# Patient Record
Sex: Female | Born: 1980 | Race: Black or African American | Hispanic: No | Marital: Single | Smoking: Never smoker
Health system: Southern US, Community
[De-identification: ages and names within clinical notes are randomized; demographics above are authoritative.]

## PROBLEM LIST (undated history)

## (undated) HISTORY — PX: OVARY BIOPSY: SHX2143

## (undated) HISTORY — PX: COLPOSCOPY: SHX161

---

## 2003-11-25 ENCOUNTER — Emergency Department (HOSPITAL_COMMUNITY): Admission: EM | Admit: 2003-11-25 | Discharge: 2003-11-26 | Payer: Self-pay | Admitting: *Deleted

## 2007-10-21 ENCOUNTER — Emergency Department: Payer: Self-pay | Admitting: Emergency Medicine

## 2008-04-21 ENCOUNTER — Emergency Department: Payer: Self-pay | Admitting: Emergency Medicine

## 2009-02-21 ENCOUNTER — Ambulatory Visit: Payer: Self-pay

## 2013-03-29 ENCOUNTER — Ambulatory Visit: Payer: Self-pay

## 2013-06-02 ENCOUNTER — Emergency Department (HOSPITAL_COMMUNITY)
Admission: EM | Admit: 2013-06-02 | Discharge: 2013-06-03 | Disposition: A | Discharge status: Home / Self Care | Payer: Self-pay | Attending: Emergency Medicine | Admitting: Emergency Medicine

## 2013-06-02 DIAGNOSIS — N760 Acute vaginitis: Secondary | ICD-10-CM | POA: Insufficient documentation

## 2013-06-02 DIAGNOSIS — Z3202 Encounter for pregnancy test, result negative: Secondary | ICD-10-CM | POA: Insufficient documentation

## 2013-06-02 DIAGNOSIS — B9689 Other specified bacterial agents as the cause of diseases classified elsewhere: Secondary | ICD-10-CM

## 2013-06-03 ENCOUNTER — Encounter (HOSPITAL_COMMUNITY): Payer: Self-pay | Admitting: Family Medicine

## 2013-06-03 LAB — WET PREP, GENITAL
Trich, Wet Prep: NONE SEEN
Yeast Wet Prep HPF POC: NONE SEEN

## 2013-06-03 LAB — GC/CHLAMYDIA PROBE AMP: GC Probe RNA: NEGATIVE

## 2013-06-03 LAB — URINALYSIS, ROUTINE W REFLEX MICROSCOPIC
Leukocytes, UA: NEGATIVE
Nitrite: NEGATIVE
Specific Gravity, Urine: 1.032 — ABNORMAL HIGH (ref 1.005–1.030)
Urobilinogen, UA: 0.2 mg/dL (ref 0.0–1.0)
pH: 6 (ref 5.0–8.0)

## 2013-06-03 MED ORDER — METRONIDAZOLE 500 MG PO TABS: 500.0000 mg | ORAL_TABLET | Freq: Two times a day (BID) | ORAL | Status: DC

## 2013-06-03 NOTE — ED Provider Notes (Signed)
CSN: [id number]     Arrival date & time 06/02/13  2239 History   First MD Initiated Contact with Patient 06/03/13 0037     Chief Complaint  Patient presents with  . Vaginal Bleeding   (Consider location/radiation/quality/duration/timing/severity/associated sxs/prior Treatment) HPI Comments: Patient is a 32 year old female with a history of colposcopy who presents for lack of vaginal bleeding x3 days. Patient states that she has been experiencing vaginal bleeding for the last 3 years. Patient states she usually goes through between 5-8 pads per day depending on her flow. Patient states that bleeding stopped this past Monday. Patient states she was evaluated for her vaginal bleeding in the past, but was unable to determine why she was bleeding so consistently. Patient does endorse associated b/l breast soreness. She denies any associated fever, shortness of breath, nausea or vomiting, abdominal pain, nipple discharge or bleeding, pelvic pain, vaginal discharge, dyspareunia, dysuria, hematuria, back pain, and numbness or tingling in her extremities.   Patient is a 32 y.o. female presenting with vaginal bleeding. The history is provided by the patient. No language interpreter was used.  Vaginal Bleeding Associated symptoms: no fever     History reviewed. No pertinent past medical history. Past Surgical History  Procedure Laterality Date  . Ovary biopsy    . Colposcopy     No family history on file. History  Substance Use Topics  . Smoking status: Never Smoker   . Smokeless tobacco: Not on file  . Alcohol Use: Yes     Comment: Occasional   OB History   Grav Para Term Preterm Abortions TAB SAB Ect Mult Living                 Review of Systems  Constitutional: Negative for fever.  Genitourinary: Negative for vaginal bleeding.  All other systems reviewed and are negative.   Allergies  Imitrex  Home Medications   Current Outpatient Rx  Name  Route  Sig  Dispense  Refill  .  Multiple Vitamin (MULTIVITAMIN WITH MINERALS) TABS tablet   Oral   Take 1 tablet by mouth daily.         . metroNIDAZOLE (FLAGYL) 500 MG tablet   Oral   Take 1 tablet (500 mg total) by mouth 2 (two) times daily.   14 tablet   0    BP 152/71  Pulse 84  Temp(Src) 98.3 F (36.8 C) (Oral)  Resp 20  Ht 5\' 8"  (1.727 m)  Wt 220 lb (99.791 kg)  BMI 33.46 kg/m2  SpO2 100%  Physical Exam  Nursing note and vitals reviewed. Constitutional: She is oriented to person, place, and time. She appears well-developed and well-nourished. No distress.  HENT:  Head: Normocephalic and atraumatic.  Mouth/Throat: Oropharynx is clear and moist. No oropharyngeal exudate.  Eyes: Conjunctivae and EOM are normal. Pupils are equal, round, and reactive to light. No scleral icterus.  Neck: Normal range of motion.  Cardiovascular: Normal rate, regular rhythm and normal heart sounds.   Pulmonary/Chest: Effort normal and breath sounds normal. No respiratory distress. She has no wheezes. She has no rales.  Abdominal: Soft. She exhibits no distension. There is no tenderness. There is no rebound and no guarding.  Genitourinary: Vagina normal and uterus normal. There is no rash, tenderness, lesion or injury on the right labia. There is no rash, tenderness, lesion or injury on the left labia. Cervix exhibits no motion tenderness, no discharge and no friability. Right adnexum displays no mass, no tenderness and no fullness.  Left adnexum displays no mass, no tenderness and no fullness.  Chaperoned GYN exam unremarkable  Musculoskeletal: Normal range of motion.  Neurological: She is alert and oriented to person, place, and time.  Skin: Skin is warm and dry. No rash noted. She is not diaphoretic. No erythema. No pallor.  Psychiatric: She has a normal mood and affect. Her behavior is normal.    ED Course  Procedures (including critical care time) Labs Review Labs Reviewed  WET PREP, GENITAL - Abnormal; Notable for  the following:    Clue Cells Wet Prep HPF POC MANY (*)    WBC, Wet Prep HPF POC RARE (*)    All other components within normal limits  URINALYSIS, ROUTINE W REFLEX MICROSCOPIC - Abnormal; Notable for the following:    APPearance CLOUDY (*)    Specific Gravity, Urine 1.032 (*)    All other components within normal limits  GC/CHLAMYDIA PROBE AMP  PREGNANCY, URINE   Imaging Review No results found.  MDM   1. Bacterial vaginosis    32 year old otherwise healthy female presents for a lack of vaginal bleeding x3 days. Patient with history of persistent vaginal bleeding for the last 3 years. Patient well and nontoxic appearing on initial presentation. She is hemodynamically stable and afebrile. Physical exam findings unremarkable and chaperoned GYN exam without any significant findings or tenderness. Urine pregnancy negative and urinalysis findings nonsuggestive of UTI. Wet prep findings consistent with bacterial vaginosis as many clue cells seen. GC chlamydia culture pending. Given patient's hemodynamic stability and reassuring physical exam, do not believe further workup with labs or imaging is warranted at this time. Patient stable and appropriate for discharge with OB/GYN followup for further evaluation of symptoms. Flagyl prescribed for treatment of bacterial vaginosis. Return precautions discussed and patient agreeable to plan with no unaddressed concerns.    Antony Madura, PA-C 06/03/13 [id number]

## 2013-06-03 NOTE — ED Provider Notes (Signed)
Medical screening examination/treatment/procedure(s) were performed by non-physician practitioner and as supervising physician I was immediately available for consultation/collaboration.  Shakur Lembo M Kerrianne Jeng, MD 06/03/13 0647 

## 2013-06-03 NOTE — ED Notes (Signed)
Patient states that she has had vaginal bleeding constantly for the past 3 years. States that the bleeding stopped on Monday and she is concerned "because this is unusual. I need to know what is going on." Denies n/v/d or cramping. Only other associated symptoms are breast soreness.

## 2014-05-02 ENCOUNTER — Emergency Department (INDEPENDENT_AMBULATORY_CARE_PROVIDER_SITE_OTHER)
Admission: EM | Admit: 2014-05-02 | Discharge: 2014-05-02 | Disposition: A | Discharge status: Home / Self Care | Payer: PRIVATE HEALTH INSURANCE | Source: Home / Self Care

## 2014-05-02 ENCOUNTER — Encounter (HOSPITAL_COMMUNITY): Payer: Self-pay | Admitting: Emergency Medicine

## 2014-05-02 DIAGNOSIS — H109 Unspecified conjunctivitis: Secondary | ICD-10-CM

## 2014-05-02 MED ORDER — DEXAMETHASONE 0.1 % OP SUSP: 1.0000 [drp] | Freq: Two times a day (BID) | OPHTHALMIC | Status: AC

## 2014-05-02 MED ORDER — TETRACAINE HCL 0.5 % OP SOLN
OPHTHALMIC | Status: AC
  Filled 2014-05-02: qty 2

## 2014-05-02 MED ORDER — POLYMYXIN B-TRIMETHOPRIM 10000-0.1 UNIT/ML-% OP SOLN: 1.0000 [drp] | OPHTHALMIC | Status: AC

## 2014-05-02 NOTE — ED Provider Notes (Signed)
CSN: [id number]     Arrival date & time 05/02/14  1039 History   First MD Initiated Contact with Patient 05/02/14 1135     Chief Complaint  Patient presents with  . Eye Pain   (Consider location/radiation/quality/duration/timing/severity/associated sxs/prior Treatment) HPI Comments: C/O watery itchy eyes that started gradually yesterday AM. Mild blurring. Denies FB sensation or pain.    History reviewed. No pertinent past medical history. Past Surgical History  Procedure Laterality Date  . Ovary biopsy    . Colposcopy     History reviewed. No pertinent family history. History  Substance Use Topics  . Smoking status: Never Smoker   . Smokeless tobacco: Not on file  . Alcohol Use: Yes     Comment: Occasional   OB History   Grav Para Term Preterm Abortions TAB SAB Ect Mult Living                 Review of Systems  Constitutional: Positive for activity change.  Eyes: Positive for photophobia, discharge, redness, itching and visual disturbance.  All other systems reviewed and are negative.   Allergies  Imitrex  Home Medications   Prior to Admission medications   Medication Sig Start Date End Date Taking? Authorizing Provider  dexamethasone (DECADRON) 0.1 % ophthalmic suspension Place 1 drop into both eyes 2 (two) times daily. 05/02/14   Hayden Rasmussenavid Harding Thomure, NP  Multiple Vitamin (MULTIVITAMIN WITH MINERALS) TABS tablet Take 1 tablet by mouth daily.    Historical Provider, MD  trimethoprim-polymyxin b (POLYTRIM) ophthalmic solution Place 1 drop into both eyes every 4 (four) hours. 05/02/14   Hayden Rasmussenavid Ridley Dileo, NP   BP 149/79  Pulse 79  Temp(Src) 98.3 F (36.8 C) (Oral)  Resp 18  SpO2 96% Physical Exam  Nursing note and vitals reviewed. Constitutional: She is oriented to person, place, and time. She appears well-developed and well-nourished. No distress.  Eyes: EOM are normal. Pupils are equal, round, and reactive to light.  Minor conjunctival redness. Minor scleral injection.   Anterior chamber clear. No current drainage.  Neck: Normal range of motion. Neck supple.  Pulmonary/Chest: Effort normal. No respiratory distress.  Neurological: She is alert and oriented to person, place, and time.  Skin: Skin is warm.  Psychiatric: She has a normal mood and affect.    ED Course  Procedures (including critical care time) Labs Review Labs Reviewed - No data to display  Imaging Review No results found.   MDM   1. Bilateral conjunctivitis    polytrim op gttts dexamethsone opthal as dir. F/U with opthal listed above if not improving in 2 d.     Hayden Rasmussenavid Taitum Alms, NP 05/02/14 1153

## 2014-05-02 NOTE — ED Notes (Addendum)
C/o bilateral eye pain and redness. "I tink I have pink eye in both eyes"

## 2014-05-02 NOTE — ED Provider Notes (Signed)
Medical screening examination/treatment/procedure(s) were performed by a resident physician or non-physician practitioner and as the supervising physician I was immediately available for consultation/collaboration.  Janique Hoefer, MD Family Medicine   Lonnette Shrode J Jmya Uliano, MD 05/02/14 2136 

## 2014-05-02 NOTE — Discharge Instructions (Signed)
Eye Drops Use eye drops as directed. It may be easier to have someone help you put the drops in your eye. If you are alone, use the following instructions to help you.  Wash your hands before putting drops in your eyes.  Read the label and look at your medication. Check for any expiration date that may appear on the bottle or tube. Changes of color may be a warning that the medication is old or ineffective. This is especially true if the medication has become brown in color. If you have questions or concerns, call your caregiver. DROPS  Tilt your head back with the affected eye uppermost. Gently pull down on your lower lid. Do not pull up on the upper lid.  Look up. Place the dropper or bottle just over the edge of the lower lid near the white portion at the bottom of the eye. The goal is to have the drop go into the little sac formed by the lower lid and the bottom of the eye itself. Do not release the drop from a height of several inches over the eye. That will only serve to startle the person receiving the medicine when it lands and forces a blink.  Steady your hand in a comfortable manner. An example would be to hold the dropper or bottle between your thumb and index (pointing) finger. Lean your index finger against the brow.  Then, slowly and gently squeeze one drop of medication into your eye.  Once the medication has been applied, place your finger between the lower eyelid and the nose, pressing firmly against the nose for 5-10 seconds. This will slow the process of the eye drop entering the small canal that normally drains tears into the nose, and therefore increases the exposure of the medicine to the eye for a few extra seconds. OINTMENTS  Look up. Place the tip of the tube just over the edge of the lower lid near the white portion at the bottom of the eye. The goal is to create a line of ointment along the inner surface of the eyelid in the little sac formed by the lower lid and the  bottom of the eye itself.  Avoid touching the tube tip to your eyeball or eyelid. This avoids contamination of the tube or the medicine in the tube.  Once a line of medicine has been created, hold the upper lid up and look down before releasing the upper lid. This will force the ointment to spread over the surface of the eye.  Your vision will be very blurry for a few minutes after applying an ointment properly. This is normal and will clear as you continue to blink. For this reason, it is best to apply ointments just before going to sleep, or at a time when you can rest your eyes for 5-10 minutes after applying the medication. GENERAL  Store your medicine in a cool, dry place after each use.  If you need a second medication, wait at least two minutes. This helps the first medication to be taken up (absorbed) by the eye.  If you have been instructed to use both an eye drop and an eye ointment, always apply the drop first and then the ointment 3-4 minutes afterward. Never put medications into the eye unless the label reads, "For Ophthalmic Use," "For Use In Eyes" or "Eye Drops." If you have questions, call your caregiver. Document Released: 12/16/2000 Document Revised: 01/24/2014 Document Reviewed: 02/21/2009 ExitCare Patient Information 2015 [employer]. This   information is not intended to replace advice given to you by your health care provider. Make sure you discuss any questions you have with your health care provider.  Conjunctivitis The cause of your eye symptoms appear most like allergic origin. Less likely bacterial pink eye. Use warm compresses to your eyes for comfort and cleaning. Conjunctivitis is commonly called "pink eye." Conjunctivitis can be caused by bacterial or viral infection, allergies, or injuries. There is usually redness of the lining of the eye, itching, discomfort, and sometimes discharge. There may be deposits of matter along the eyelids. A viral infection usually  causes a watery discharge, while a bacterial infection causes a yellowish, thick discharge. Pink eye is very contagious and spreads by direct contact. You may be given antibiotic eyedrops as part of your treatment. Before using your eye medicine, remove all drainage from the eye by washing gently with warm water and cotton balls. Continue to use the medication until you have awakened 2 mornings in a row without discharge from the eye. Do not rub your eye. This increases the irritation and helps spread infection. Use separate towels from other household members. Wash your hands with soap and water before and after touching your eyes. Use cold compresses to reduce pain and sunglasses to relieve irritation from light. Do not wear contact lenses or wear eye makeup until the infection is gone. SEEK MEDICAL CARE IF:   Your symptoms are not better after 3 days of treatment.  You have increased pain or trouble seeing.  The outer eyelids become very red or swollen. Document Released: 10/17/2004 Document Revised: 12/02/2011 Document Reviewed: 09/09/2005 [facility] Patient Information 2015 [place]. This information is not intended to replace advice given to you by your health care provider. Make sure you discuss any questions you have with your health care provider.

## 2014-09-22 ENCOUNTER — Encounter (HOSPITAL_COMMUNITY)
Admission: EM | Disposition: A | Discharge status: Home / Self Care | Payer: Self-pay | Source: Home / Self Care | Attending: Emergency Medicine

## 2014-09-22 ENCOUNTER — Emergency Department (HOSPITAL_COMMUNITY): Payer: PRIVATE HEALTH INSURANCE

## 2014-09-22 ENCOUNTER — Other Ambulatory Visit (HOSPITAL_COMMUNITY): Payer: Self-pay | Admitting: Orthopedic Surgery

## 2014-09-22 ENCOUNTER — Emergency Department (HOSPITAL_COMMUNITY): Payer: PRIVATE HEALTH INSURANCE | Admitting: Certified Registered"

## 2014-09-22 ENCOUNTER — Ambulatory Visit (HOSPITAL_COMMUNITY)
Admission: EM | Admit: 2014-09-22 | Discharge: 2014-09-25 | Disposition: A | Discharge status: Home / Self Care | Payer: PRIVATE HEALTH INSURANCE | Attending: Emergency Medicine | Admitting: Emergency Medicine

## 2014-09-22 ENCOUNTER — Ambulatory Visit: Admit: 2014-09-22 | Payer: Self-pay | Admitting: Orthopedic Surgery

## 2014-09-22 ENCOUNTER — Encounter (HOSPITAL_COMMUNITY): Payer: Self-pay | Admitting: Anesthesiology

## 2014-09-22 DIAGNOSIS — Z888 Allergy status to other drugs, medicaments and biological substances status: Secondary | ICD-10-CM | POA: Insufficient documentation

## 2014-09-22 DIAGNOSIS — S82851A Displaced trimalleolar fracture of right lower leg, initial encounter for closed fracture: Secondary | ICD-10-CM | POA: Insufficient documentation

## 2014-09-22 DIAGNOSIS — Y9289 Other specified places as the place of occurrence of the external cause: Secondary | ICD-10-CM | POA: Diagnosis not present

## 2014-09-22 DIAGNOSIS — W1789XA Other fall from one level to another, initial encounter: Secondary | ICD-10-CM | POA: Insufficient documentation

## 2014-09-22 DIAGNOSIS — W19XXXA Unspecified fall, initial encounter: Secondary | ICD-10-CM

## 2014-09-22 DIAGNOSIS — S82853A Displaced trimalleolar fracture of unspecified lower leg, initial encounter for closed fracture: Secondary | ICD-10-CM | POA: Diagnosis present

## 2014-09-22 HISTORY — PX: ORIF ANKLE FRACTURE: SHX5408

## 2014-09-22 LAB — TYPE AND SCREEN
ABO/RH(D): A POS
Antibody Screen: NEGATIVE

## 2014-09-22 LAB — BASIC METABOLIC PANEL
ANION GAP: 9 (ref 5–15)
BUN: 11 mg/dL (ref 6–23)
CALCIUM: 9.1 mg/dL (ref 8.4–10.5)
CO2: 24 mmol/L (ref 19–32)
Chloride: 106 mEq/L (ref 96–112)
Creatinine, Ser: 0.75 mg/dL (ref 0.50–1.10)
GFR calc Af Amer: >90 mL/min (ref >90)
Glucose, Bld: 114 mg/dL — ABNORMAL HIGH (ref 70–99)
POTASSIUM: 3.7 mmol/L (ref 3.5–5.1)
SODIUM: 139 mmol/L (ref 135–145)

## 2014-09-22 LAB — CBC WITH DIFFERENTIAL/PLATELET
BASOS ABS: 0 10*3/uL (ref 0.0–0.1)
Basophils Relative: 0 % (ref 0–1)
EOS ABS: 0.1 10*3/uL (ref 0.0–0.7)
EOS PCT: 1 % (ref 0–5)
HCT: 40.7 % (ref 36.0–46.0)
Hemoglobin: 12.7 g/dL (ref 12.0–15.0)
LYMPHS PCT: 48 % — AB (ref 12–46)
Lymphs Abs: 3.2 10*3/uL (ref 0.7–4.0)
MCH: 24.5 pg — ABNORMAL LOW (ref 26.0–34.0)
MCHC: 31.2 g/dL (ref 30.0–36.0)
MCV: 78.6 fL (ref 78.0–100.0)
Monocytes Absolute: 0.4 10*3/uL (ref 0.1–1.0)
Monocytes Relative: 6 % (ref 3–12)
NEUTROS PCT: 45 % (ref 43–77)
Neutro Abs: 3 10*3/uL (ref 1.7–7.7)
PLATELETS: 346 10*3/uL (ref 150–400)
RBC: 5.18 MIL/uL — ABNORMAL HIGH (ref 3.87–5.11)
RDW: 14.9 % (ref 11.5–15.5)
WBC: 6.8 10*3/uL (ref 4.0–10.5)

## 2014-09-22 LAB — POC URINE PREG, ED: PREG TEST UR: NEGATIVE

## 2014-09-22 LAB — ABO/RH: ABO/RH(D): A POS

## 2014-09-22 SURGERY — OPEN REDUCTION INTERNAL FIXATION (ORIF) ANKLE FRACTURE
Anesthesia: General | Site: Ankle | Laterality: Right

## 2014-09-22 MED ORDER — MORPHINE SULFATE 4 MG/ML IJ SOLN
6.0000 mg | Freq: Once | INTRAMUSCULAR | Status: AC
  Administered 2014-09-22: 6 mg via INTRAVENOUS

## 2014-09-22 MED ORDER — PROMETHAZINE HCL 25 MG/ML IJ SOLN
INTRAMUSCULAR | Status: AC
  Filled 2014-09-22: qty 1

## 2014-09-22 MED ORDER — METHOCARBAMOL 500 MG PO TABS
ORAL_TABLET | ORAL | Status: AC
  Filled 2014-09-22: qty 1

## 2014-09-22 MED ORDER — LIDOCAINE HCL (CARDIAC) 20 MG/ML IV SOLN
INTRAVENOUS | Status: AC
  Filled 2014-09-22: qty 5

## 2014-09-22 MED ORDER — FENTANYL CITRATE 0.05 MG/ML IJ SOLN
INTRAMUSCULAR | Status: DC | PRN
  Administered 2014-09-22: 50 ug via INTRAVENOUS
  Administered 2014-09-22: 25 ug via INTRAVENOUS
  Administered 2014-09-22: 50 ug via INTRAVENOUS
  Administered 2014-09-22: 25 ug via INTRAVENOUS
  Administered 2014-09-22: 100 ug via INTRAVENOUS

## 2014-09-22 MED ORDER — POLYETHYLENE GLYCOL 3350 17 G PO PACK: 17.0000 g | PACK | Freq: Every day | ORAL | Status: DC | PRN

## 2014-09-22 MED ORDER — 0.9 % SODIUM CHLORIDE (POUR BTL) OPTIME
TOPICAL | Status: DC | PRN
  Administered 2014-09-22: 1000 mL

## 2014-09-22 MED ORDER — DOCUSATE SODIUM 100 MG PO CAPS
100.0000 mg | ORAL_CAPSULE | Freq: Two times a day (BID) | ORAL | Status: DC
  Administered 2014-09-22 – 2014-09-25 (×6): 100 mg via ORAL
  Filled 2014-09-22 (×7): qty 1

## 2014-09-22 MED ORDER — ONDANSETRON HCL 4 MG/2ML IJ SOLN: 4.0000 mg | Freq: Four times a day (QID) | INTRAMUSCULAR | Status: DC | PRN

## 2014-09-22 MED ORDER — ARTIFICIAL TEARS OP OINT
TOPICAL_OINTMENT | OPHTHALMIC | Status: DC | PRN
  Administered 2014-09-22: 1 via OPHTHALMIC

## 2014-09-22 MED ORDER — MAGNESIUM CITRATE PO SOLN: 1.0000 | Freq: Once | ORAL | Status: AC | PRN

## 2014-09-22 MED ORDER — MIDAZOLAM HCL 2 MG/2ML IJ SOLN
INTRAMUSCULAR | Status: AC
  Filled 2014-09-22: qty 2

## 2014-09-22 MED ORDER — LACTATED RINGERS IV SOLN
INTRAVENOUS | Status: DC | PRN
  Administered 2014-09-22: 18:00:00 via INTRAVENOUS

## 2014-09-22 MED ORDER — DEXAMETHASONE SODIUM PHOSPHATE 4 MG/ML IJ SOLN
INTRAMUSCULAR | Status: DC | PRN
  Administered 2014-09-22: 8 mg via INTRAVENOUS

## 2014-09-22 MED ORDER — ONDANSETRON HCL 4 MG/2ML IJ SOLN
INTRAMUSCULAR | Status: DC | PRN
  Administered 2014-09-22: 4 mg via INTRAVENOUS

## 2014-09-22 MED ORDER — SODIUM CHLORIDE 0.9 % IV SOLN
INTRAVENOUS | Status: DC
  Administered 2014-09-22: 16:00:00 via INTRAVENOUS

## 2014-09-22 MED ORDER — PROMETHAZINE HCL 25 MG/ML IJ SOLN
6.2500 mg | INTRAMUSCULAR | Status: DC | PRN
  Administered 2014-09-22: 6.25 mg via INTRAVENOUS

## 2014-09-22 MED ORDER — HYDROMORPHONE HCL 1 MG/ML IJ SOLN
1.0000 mg | Freq: Once | INTRAMUSCULAR | Status: AC
  Administered 2014-09-22: 1 mg via INTRAVENOUS
  Filled 2014-09-22: qty 1

## 2014-09-22 MED ORDER — CEFAZOLIN SODIUM 1-5 GM-% IV SOLN
INTRAVENOUS | Status: AC
  Filled 2014-09-22: qty 50

## 2014-09-22 MED ORDER — ASPIRIN EC 325 MG PO TBEC
325.0000 mg | DELAYED_RELEASE_TABLET | Freq: Every day | ORAL | Status: DC
  Administered 2014-09-23 – 2014-09-25 (×3): 325 mg via ORAL
  Filled 2014-09-22 (×3): qty 1

## 2014-09-22 MED ORDER — CEFAZOLIN SODIUM-DEXTROSE 2-3 GM-% IV SOLR
2.0000 g | Freq: Four times a day (QID) | INTRAVENOUS | Status: AC
  Administered 2014-09-22 – 2014-09-23 (×3): 2 g via INTRAVENOUS
  Filled 2014-09-22 (×3): qty 50

## 2014-09-22 MED ORDER — PROPOFOL 10 MG/ML IV BOLUS
INTRAVENOUS | Status: AC
  Filled 2014-09-22: qty 20

## 2014-09-22 MED ORDER — ONDANSETRON HCL 4 MG/2ML IJ SOLN
INTRAMUSCULAR | Status: AC
  Administered 2014-09-22: 15:00:00 4 mg via INTRAVENOUS
  Filled 2014-09-22: qty 2

## 2014-09-22 MED ORDER — OXYCODONE-ACETAMINOPHEN 5-325 MG PO TABS
1.0000 | ORAL_TABLET | ORAL | Status: DC | PRN
  Administered 2014-09-22 – 2014-09-25 (×12): 2 via ORAL
  Filled 2014-09-22 (×11): qty 2

## 2014-09-22 MED ORDER — FENTANYL CITRATE 0.05 MG/ML IJ SOLN
INTRAMUSCULAR | Status: AC
  Filled 2014-09-22: qty 5

## 2014-09-22 MED ORDER — METOCLOPRAMIDE HCL 5 MG/ML IJ SOLN: 5.0000 mg | Freq: Three times a day (TID) | INTRAMUSCULAR | Status: DC | PRN

## 2014-09-22 MED ORDER — SODIUM CHLORIDE 0.9 % IV SOLN: INTRAVENOUS | Status: DC

## 2014-09-22 MED ORDER — ONDANSETRON HCL 4 MG/2ML IJ SOLN
4.0000 mg | Freq: Once | INTRAMUSCULAR | Status: AC
  Administered 2014-09-22: 4 mg via INTRAVENOUS

## 2014-09-22 MED ORDER — METOCLOPRAMIDE HCL 10 MG PO TABS: 5.0000 mg | ORAL_TABLET | Freq: Three times a day (TID) | ORAL | Status: DC | PRN

## 2014-09-22 MED ORDER — MORPHINE SULFATE 4 MG/ML IJ SOLN
INTRAMUSCULAR | Status: AC
  Administered 2014-09-22: 6 mg via INTRAVENOUS
  Filled 2014-09-22: qty 2

## 2014-09-22 MED ORDER — LIDOCAINE HCL (CARDIAC) 20 MG/ML IV SOLN
INTRAVENOUS | Status: DC | PRN
  Administered 2014-09-22: 60 mg via INTRAVENOUS

## 2014-09-22 MED ORDER — DEXAMETHASONE SODIUM PHOSPHATE 4 MG/ML IJ SOLN
INTRAMUSCULAR | Status: AC
  Filled 2014-09-22: qty 2

## 2014-09-22 MED ORDER — ONDANSETRON HCL 4 MG PO TABS: 4.0000 mg | ORAL_TABLET | Freq: Four times a day (QID) | ORAL | Status: DC | PRN

## 2014-09-22 MED ORDER — HYDROMORPHONE HCL 1 MG/ML IJ SOLN
INTRAMUSCULAR | Status: AC
  Administered 2014-09-23: 1 mg via INTRAVENOUS
  Filled 2014-09-22: qty 2

## 2014-09-22 MED ORDER — CEFAZOLIN SODIUM-DEXTROSE 2-3 GM-% IV SOLR
2.0000 g | INTRAVENOUS | Status: AC
  Administered 2014-09-22: 3 g via INTRAVENOUS

## 2014-09-22 MED ORDER — ONDANSETRON HCL 4 MG/2ML IJ SOLN
INTRAMUSCULAR | Status: AC
  Filled 2014-09-22: qty 2

## 2014-09-22 MED ORDER — BISACODYL 5 MG PO TBEC: 5.0000 mg | DELAYED_RELEASE_TABLET | Freq: Every day | ORAL | Status: DC | PRN

## 2014-09-22 MED ORDER — HYDROMORPHONE HCL 1 MG/ML IJ SOLN
0.5000 mg | INTRAMUSCULAR | Status: DC | PRN
  Administered 2014-09-23 – 2014-09-25 (×9): 1 mg via INTRAVENOUS
  Filled 2014-09-22 (×9): qty 1

## 2014-09-22 MED ORDER — MIDAZOLAM HCL 5 MG/5ML IJ SOLN
INTRAMUSCULAR | Status: DC | PRN
  Administered 2014-09-22: 2 mg via INTRAVENOUS

## 2014-09-22 MED ORDER — DEXTROSE 5 % IV SOLN: 500.0000 mg | Freq: Four times a day (QID) | INTRAVENOUS | Status: DC | PRN

## 2014-09-22 MED ORDER — HYDROMORPHONE HCL 1 MG/ML IJ SOLN
0.2500 mg | INTRAMUSCULAR | Status: DC | PRN
  Administered 2014-09-22: 1 mg via INTRAVENOUS

## 2014-09-22 MED ORDER — METHOCARBAMOL 500 MG PO TABS
500.0000 mg | ORAL_TABLET | Freq: Four times a day (QID) | ORAL | Status: DC | PRN
Start: 2014-09-22 — End: 2014-09-25
  Administered 2014-09-22 – 2014-09-25 (×9): 500 mg via ORAL
  Filled 2014-09-22 (×8): qty 1

## 2014-09-22 MED ORDER — OXYCODONE-ACETAMINOPHEN 5-325 MG PO TABS
ORAL_TABLET | ORAL | Status: AC
  Administered 2014-09-23: 2 via ORAL
  Filled 2014-09-22: qty 2

## 2014-09-22 MED ORDER — PROPOFOL 10 MG/ML IV BOLUS
INTRAVENOUS | Status: DC | PRN
  Administered 2014-09-22: 200 mg via INTRAVENOUS

## 2014-09-22 SURGICAL SUPPLY — 47 items
BANDAGE ESMARK 6X9 LF (GAUZE/BANDAGES/DRESSINGS) ×1 IMPLANT
BIT DRILL 2.5X110 QC LCP DISP (BIT) ×3 IMPLANT
BNDG COHESIVE 4X5 TAN STRL (GAUZE/BANDAGES/DRESSINGS) ×3 IMPLANT
BNDG COHESIVE 6X5 TAN STRL LF (GAUZE/BANDAGES/DRESSINGS) ×3 IMPLANT
BNDG ESMARK 6X9 LF (GAUZE/BANDAGES/DRESSINGS) ×3
BNDG GAUZE ELAST 4 BULKY (GAUZE/BANDAGES/DRESSINGS) ×3 IMPLANT
COVER SURGICAL LIGHT HANDLE (MISCELLANEOUS) ×3 IMPLANT
CUFF TOURNIQUET SINGLE 34IN LL (TOURNIQUET CUFF) IMPLANT
CUFF TOURNIQUET SINGLE 44IN (TOURNIQUET CUFF) IMPLANT
DRAPE INCISE IOBAN 66X45 STRL (DRAPES) ×3 IMPLANT
DRAPE OEC MINIVIEW 54X84 (DRAPES) ×3 IMPLANT
DRAPE PROXIMA HALF (DRAPES) ×3 IMPLANT
DRAPE U-SHAPE 47X51 STRL (DRAPES) ×3 IMPLANT
DRSG ADAPTIC 3X8 NADH LF (GAUZE/BANDAGES/DRESSINGS) ×3 IMPLANT
DRSG PAD ABDOMINAL 8X10 ST (GAUZE/BANDAGES/DRESSINGS) ×3 IMPLANT
DURAPREP 26ML APPLICATOR (WOUND CARE) ×3 IMPLANT
ELECT REM PT RETURN 9FT ADLT (ELECTROSURGICAL) ×3
ELECTRODE REM PT RTRN 9FT ADLT (ELECTROSURGICAL) ×1 IMPLANT
GAUZE SPONGE 4X4 12PLY STRL (GAUZE/BANDAGES/DRESSINGS) ×3 IMPLANT
GLOVE BIOGEL PI IND STRL 9 (GLOVE) ×1 IMPLANT
GLOVE BIOGEL PI INDICATOR 9 (GLOVE) ×2
GLOVE SURG ORTHO 9.0 STRL STRW (GLOVE) ×3 IMPLANT
GOWN STRL REUS W/ TWL XL LVL3 (GOWN DISPOSABLE) ×2 IMPLANT
GOWN STRL REUS W/TWL XL LVL3 (GOWN DISPOSABLE) ×4
KIT BASIN OR (CUSTOM PROCEDURE TRAY) ×3 IMPLANT
KIT ROOM TURNOVER OR (KITS) ×3 IMPLANT
MANIFOLD NEPTUNE II (INSTRUMENTS) ×3 IMPLANT
NS IRRIG 1000ML POUR BTL (IV SOLUTION) ×3 IMPLANT
PACK ORTHO EXTREMITY (CUSTOM PROCEDURE TRAY) ×3 IMPLANT
PAD ARMBOARD 7.5X6 YLW CONV (MISCELLANEOUS) ×6 IMPLANT
PLATE LCP 3.5 1/3 TUB 7HX81 (Plate) ×3 IMPLANT
SCREW CORTEX 3.5 12MM (Screw) ×4 IMPLANT
SCREW CORTEX 3.5 14MM (Screw) ×2 IMPLANT
SCREW CORTEX 3.5 22MM (Screw) ×2 IMPLANT
SCREW LOCK CORT ST 3.5X12 (Screw) ×2 IMPLANT
SCREW LOCK CORT ST 3.5X14 (Screw) ×1 IMPLANT
SCREW LOCK CORT ST 3.5X22 (Screw) ×1 IMPLANT
SCREW LOCKING 3.5 (Screw) ×6 IMPLANT
SPONGE LAP 18X18 X RAY DECT (DISPOSABLE) IMPLANT
STAPLER VISISTAT 35W (STAPLE) IMPLANT
SUCTION FRAZIER TIP 10 FR DISP (SUCTIONS) ×3 IMPLANT
SUT ETHILON 2 0 PSLX (SUTURE) IMPLANT
SUT VIC AB 2-0 CTB1 (SUTURE) ×6 IMPLANT
TOWEL OR 17X24 6PK STRL BLUE (TOWEL DISPOSABLE) ×3 IMPLANT
TOWEL OR 17X26 10 PK STRL BLUE (TOWEL DISPOSABLE) ×3 IMPLANT
TUBE CONNECTING 12'X1/4 (SUCTIONS) ×1
TUBE CONNECTING 12X1/4 (SUCTIONS) ×2 IMPLANT

## 2014-09-22 NOTE — Anesthesia Procedure Notes (Signed)
Procedure Name: LMA Insertion Date/Time: 09/22/2014 6:42 PM Performed by: Lanell MatarBAKER, Adaijah Endres M Pre-anesthesia Checklist: Patient identified, Timeout performed, Emergency Drugs available and Suction available Patient Re-evaluated:Patient Re-evaluated prior to inductionOxygen Delivery Method: Circle system utilized Preoxygenation: Pre-oxygenation with 100% oxygen Intubation Type: IV induction Ventilation: Mask ventilation without difficulty LMA: LMA inserted LMA Size: 4.0 Number of attempts: 1 Placement Confirmation: positive ETCO2 and breath sounds checked- equal and bilateral Tube secured with: Tape

## 2014-09-22 NOTE — Anesthesia Preprocedure Evaluation (Addendum)
Anesthesia Evaluation  Patient identified by MRN, date of birth, ID band Patient awake    Reviewed: Allergy & Precautions, H&P , NPO status , Patient's Chart, lab work & pertinent test results  History of Anesthesia Complications Negative for: history of anesthetic complications  Airway Mallampati: I  TM Distance: >3 FB Neck ROM: Full    Dental no notable dental hx. (+) Teeth Intact   Pulmonary neg pulmonary ROS,  breath sounds clear to auscultation  Pulmonary exam normal       Cardiovascular negative cardio ROS  IRhythm:Regular Rate:Normal     Neuro/Psych negative neurological ROS  negative psych ROS   GI/Hepatic negative GI ROS, Neg liver ROS,   Endo/Other  negative endocrine ROS  Renal/GU negative Renal ROS  negative genitourinary   Musculoskeletal   Abdominal   Peds  Hematology negative hematology ROS (+)   Anesthesia Other Findings   Reproductive/Obstetrics negative OB ROS                            Anesthesia Physical Anesthesia Plan  ASA: I  Anesthesia Plan: General   Post-op Pain Management:    Induction: Intravenous  Airway Management Planned: LMA and Oral ETT  Additional Equipment:   Intra-op Plan:   Post-operative Plan: Extubation in OR  Informed Consent: I have reviewed the patients History and Physical, chart, labs and discussed the procedure including the risks, benefits and alternatives for the proposed anesthesia with the patient or authorized representative who has indicated his/her understanding and acceptance.     Plan Discussed with: CRNA and Surgeon  Anesthesia Plan Comments:        Anesthesia Quick Evaluation

## 2014-09-22 NOTE — Transfer of Care (Signed)
Immediate Anesthesia Transfer of Care Note  Patient: Katrina Snyder  Procedure(s) Performed: Procedure(s): OPEN REDUCTION INTERNAL FIXATION (ORIF) ANKLE FRACTURE (Right)  Patient Location: PACU  Anesthesia Type:General  Level of Consciousness: awake, alert  and oriented  Airway & Oxygen Therapy: Patient Spontanous Breathing and Patient connected to nasal cannula oxygen  Post-op Assessment: Report given to PACU RN, Post -op Vital signs reviewed and stable and Patient moving all extremities X 4  Post vital signs: Reviewed and stable  Complications: No apparent anesthesia complications

## 2014-09-22 NOTE — Anesthesia Postprocedure Evaluation (Signed)
  Anesthesia Post-op Note  Patient: Katrina Snyder  Procedure(s) Performed: Procedure(s): OPEN REDUCTION INTERNAL FIXATION (ORIF) ANKLE FRACTURE (Right)  Patient Location: PACU  Anesthesia Type:General  Level of Consciousness: awake and alert   Airway and Oxygen Therapy: Patient Spontanous Breathing  Post-op Pain: mild  Post-op Assessment: Post-op Vital signs reviewed, Patient's Cardiovascular Status Stable and Respiratory Function Stable  Post-op Vital Signs: Reviewed  Filed Vitals:   09/22/14 2005  BP:   Pulse: 94  Temp:   Resp: 14    Complications: No apparent anesthesia complications

## 2014-09-22 NOTE — ED Provider Notes (Signed)
TIME SEEN: 2:35 PM  CHIEF COMPLAINT: Fall, right ankle pain  HPI: Pt is a 33 y.o. F with history of migraines who presents to the emergency department after she slipped and fell that a 6 foot incline injuring her right ankle. She states she felt a pop and then felt her ankle go back in place. She has some numbness in her toes but no other numbness or weakness. Denies hitting her head or losing consciousness. No neck or back pain. No other injury. States she's been nothing by mouth since 3 AM she has been with her [facility] is not doing well.  ROS: See HPI Constitutional: no fever  Eyes: no drainage  ENT: no runny nose   Cardiovascular:  no chest pain  Resp: no SOB  GI: no vomiting GU: no dysuria Integumentary: no rash  Allergy: no hives  Musculoskeletal: no leg swelling  Neurological: no slurred speech ROS otherwise negative  PAST MEDICAL HISTORY/PAST SURGICAL HISTORY:  No past medical history on file.  MEDICATIONS:  Prior to Admission medications   Medication Sig Start Date End Date Taking? Authorizing Provider  dexamethasone (DECADRON) 0.1 % ophthalmic suspension Place 1 drop into both eyes 2 (two) times daily. 05/02/14   Hayden Rasmussenavid Mabe, NP  Multiple Vitamin (MULTIVITAMIN WITH MINERALS) TABS tablet Take 1 tablet by mouth daily.    Historical Provider, MD  trimethoprim-polymyxin b (POLYTRIM) ophthalmic solution Place 1 drop into both eyes every 4 (four) hours. 05/02/14   Hayden Rasmussenavid Mabe, NP    ALLERGIES:  Allergies  Allergen Reactions  . Imitrex [Sumatriptan] Shortness Of Breath    SOCIAL HISTORY:  History  Substance Use Topics  . Smoking status: Never Smoker   . Smokeless tobacco: Not on file  . Alcohol Use: Yes     Comment: Occasional    FAMILY HISTORY: No family history on file.  EXAM: BP 152/80 mmHg  Pulse 92  Temp(Src) 98.3 F (36.8 C) (Oral)  Resp 20  SpO2 100% CONSTITUTIONAL: Alert and oriented and responds appropriately to questions.  Well-appearing; well-nourished; GCS 15, appears very uncomfortable, tearful HEAD: Normocephalic; atraumatic EYES: Conjunctivae clear, PERRL, EOMI ENT: normal nose; no rhinorrhea; moist mucous membranes; pharynx without lesions noted; no dental injury; no septal hematoma NECK: Supple, no meningismus, no LAD; no midline spinal tenderness, step-off or deformity CARD: RRR; S1 and S2 appreciated; no murmurs, no clicks, no rubs, no gallops RESP: Normal chest excursion without splinting or tachypnea; breath sounds clear and equal bilaterally; no wheezes, no rhonchi, no rales; chest wall stable, nontender to palpation ABD/GI: Normal bowel sounds; non-distended; soft, non-tender, no rebound, no guarding PELVIS:  stable, nontender to palpation BACK:  The back appears normal and is non-tender to palpation, there is no CVA tenderness; no midline spinal tenderness, step-off or deformity EXT: Patient is extremely tender to palpation over her right ankle diffusely with ecchymosis and swelling, I'm able to Doppler a strong DP pulse in bilateral feet, patient reports some decreased sensation in her toes but no delayed capillary refill, some mild tenderness over the proximal right fibular head, no tenderness over the right hip, otherwise Normal ROM in all joints; , otherwise extremities are non-tender to palpation; no edema; normal capillary refill; no cyanosis    SKIN: Normal color for age and race; warm NEURO: Moves all extremities equally, patient reports some decreased sensation in the right toes but otherwise sensation to light touch intact diffusely, cranial nerves II through XII intact PSYCH: The patient's mood and  manner are appropriate. Grooming and personal hygiene are appropriate.  MEDICAL DECISION MAKING: Patient here with right ankle injury. She has dopplerable pulses and has some numbness in her toes but otherwise normal sensation. Normal capillary refill. Will obtain x-rays. We'll give pain  medication.  ED PROGRESS: X-ray show trimalleolar fracture. She is still has a good pulse. No tenting of the skin. Discussed with Dr. Duda with orthopedic surgery who [facility] to take her to the operating room tonight. Discussed this with patient agrees. She has been nothing by mouth in the ED and IV fluids are running. Hemodynamically stable. Discussed with orthopedic technician who will place posterior splint with stirrups and bulky dressing. Will transfer by [facility].     SPLINT APPLICATION Date/Time: 4:14 PM Authorized by: Raelyn NumberWARD, Tanai Bouler N Consent: Verbal consent obtained. Risks and benefits: risks, benefits and alternatives were discussed Consent given by: patient Splint applied by: orthopedic technician Location details: Right ankle  Splint type: posterior splint with stirrups Supplies used: pre-fabricated Post-procedure: The splinted body part was neurovascularly unchanged following the procedure. Patient tolerance: Patient tolerated the procedure well with no immediate complications.     Katrina MawKristen N Marcel Gary, DO 09/22/14 [phone]

## 2014-09-22 NOTE — ED Notes (Signed)
MD at bedside. EDP WARD PRESENT.PT ANS FAMILY AWARE OF SURGERY AND TRANSFER

## 2014-09-22 NOTE — ED Notes (Signed)
Bed: [id number] Expected date:  Expected time:  Means of arrival:  Comments: Ems- Terrance sister, ankle injury

## 2014-09-22 NOTE — Progress Notes (Signed)
  CARE MANAGEMENT ED NOTE 09/22/2014  Patient:  Katrina Snyder,Katrina Snyder   Account Number:  [id number]  Date Initiated:  09/22/2014  Documentation initiated by:  Edd ArbourGIBBS,KIMBERLY  Subjective/Objective Assessment:   33 yr old [employer] [place] pt with ankle injury     Subjective/Objective Assessment Detail:   no pcp per pt     Action/Plan:   [facility] ED CM spoke with pt on how to obtain an in network pcp with insurance coverage via the customer service number or web site  Cm reviewed ED level of care for crisis/emergent services and community pcp level of care to manage continuous   Action/Plan Detail:   continuous or chronic medical concerns.  The pt voiced understanding CM encouraged pt and discussed pt's responsibility to verify with pt's insurance carrier that any recommended medical provider offered by any emergency room or a hospital   Anticipated DC Date:       Status Recommendation to Physician:   Result of Recommendation:    Other ED Services  Consult Working Plan    DC Planning Services  Other  PCP issues  Outpatient Services - Pt will follow up    Choice offered to / List presented to:            Status of service:  Completed, signed off  ED Comments:   ED Comments Detail:

## 2014-09-22 NOTE — Progress Notes (Signed)
Orthopedic Tech Progress Note Patient Details:  Katrina Snyder 11-Sep-1981 [id number]  Ortho Devices Type of Ortho Device: Ace wrap, Stirrup splint, Post (short leg) splint Splint Material: Fiberglass Ortho Device/Splint Location: RLE Ortho Device/Splint Interventions: Ordered, Application   Jennye MoccasinHughes, Simmie Garin Craig 09/22/2014, 9:59 PM

## 2014-09-22 NOTE — ED Notes (Signed)
Per [facility]- Pt fell and slid down incline approx 6 feet resulting in right ankle injury/pain. Pt reports "feeling a pop and then going back in". No deformity present. Swelling present at ankle joint. Good CMS. Denies neck and back pain. NO LOC. Fentanyl 150 mcg given in route.

## 2014-09-22 NOTE — Progress Notes (Signed)
Respiratory lead placement changed / unable to adequately detect rr / registers as "0" vs manual 1 min count

## 2014-09-22 NOTE — Progress Notes (Signed)
Give one earring and necklace to brother terrance Haglund

## 2014-09-22 NOTE — H&P (Signed)
Katrina Snyder is an 33 y.o. female.   Chief Complaint: Right ankle pain HPI: Patient is a 33 year old woman who slipped on the wet ground sustaining a trimalleolar right ankle fracture not dislocated  History reviewed. No pertinent past medical history.  Past Surgical History  Procedure Laterality Date  . Ovary biopsy    . Colposcopy      History reviewed. No pertinent family history. Social History:  reports that she has never smoked. She does not have any smokeless tobacco history on file. She reports that she drinks alcohol. She reports that she does not use illicit drugs.  Allergies:  Allergies  Allergen Reactions  . Imitrex [Sumatriptan] Shortness Of Breath    Medications Prior to Admission  Medication Sig Dispense Refill  . ibuprofen (ADVIL,MOTRIN) 200 MG tablet Take 400 mg by mouth every 6 (six) hours as needed for moderate pain.    Marland Kitchen dexamethasone (DECADRON) 0.1 % ophthalmic suspension Place 1 drop into both eyes 2 (two) times daily. (Patient not taking: Reported on 09/22/2014) 15 mL 0  . Multiple Vitamin (MULTIVITAMIN WITH MINERALS) TABS tablet Take 1 tablet by mouth daily.    Marland Kitchen trimethoprim-polymyxin b (POLYTRIM) ophthalmic solution Place 1 drop into both eyes every 4 (four) hours. (Patient not taking: Reported on 09/22/2014) 10 mL 0    Results for orders placed or performed during the hospital encounter of 09/22/14 (from the past 48 hour(s))  CBC with Differential     Status: Abnormal   Collection Time: 09/22/14  3:04 PM  Result Value Ref Range   WBC 6.8 4.0 - 10.5 K/uL   RBC 5.18 (H) 3.87 - 5.11 MIL/uL   Hemoglobin 12.7 12.0 - 15.0 g/dL   HCT 40.7 36.0 - 46.0 %   MCV 78.6 78.0 - 100.0 fL   MCH 24.5 (L) 26.0 - 34.0 pg   MCHC 31.2 30.0 - 36.0 g/dL   RDW 14.9 11.5 - 15.5 %   Platelets 346 150 - 400 K/uL   Neutrophils Relative % 45 43 - 77 %   Neutro Abs 3.0 1.7 - 7.7 K/uL   Lymphocytes Relative 48 (H) 12 - 46 %   Lymphs Abs 3.2 0.7 - 4.0 K/uL   Monocytes  Relative 6 3 - 12 %   Monocytes Absolute 0.4 0.1 - 1.0 K/uL   Eosinophils Relative 1 0 - 5 %   Eosinophils Absolute 0.1 0.0 - 0.7 K/uL   Basophils Relative 0 0 - 1 %   Basophils Absolute 0.0 0.0 - 0.1 K/uL  Basic metabolic panel     Status: Abnormal   Collection Time: 09/22/14  3:04 PM  Result Value Ref Range   Sodium 139 135 - 145 mmol/L    Comment: Please note change in reference range.   Potassium 3.7 3.5 - 5.1 mmol/L    Comment: Please note change in reference range.   Chloride 106 96 - 112 mEq/L   CO2 24 19 - 32 mmol/L   Glucose, Bld 114 (H) 70 - 99 mg/dL   BUN 11 6 - 23 mg/dL   Creatinine, Ser 0.75 0.50 - 1.10 mg/dL   Calcium 9.1 8.4 - 10.5 mg/dL   GFR calc non Af Amer >90 >90 mL/min   GFR calc Af Amer >90 >90 mL/min    Comment: (NOTE) The eGFR has been calculated using the CKD EPI equation. This calculation has not been validated in all clinical situations. eGFR's persistently <90 mL/min signify possible Chronic Kidney Disease.  Anion gap 9 5 - 15  Type and screen     Status: None   Collection Time: 09/22/14  3:04 PM  Result Value Ref Range   ABO/RH(D) A POS    Antibody Screen NEG    Sample Expiration 09/25/2014   POC Urine Pregnancy, ED (do NOT order at [facility])     Status: None   Collection Time: 09/22/14  5:11 PM  Result Value Ref Range   Preg Test, Ur NEGATIVE NEGATIVE    Comment:        THE SENSITIVITY OF THIS METHODOLOGY IS >24 mIU/mL    Dg Tibia/fibula Right  09/22/2014   CLINICAL DATA:  Fell 6 feet and injured right lower extremity  EXAM: RIGHT ANKLE - COMPLETE 3+ VIEW; RIGHT TIBIA AND FIBULA - 2 VIEW; RIGHT FOOT COMPLETE - 3+ VIEW  COMPARISON:  None.  FINDINGS: Right tibia/fibula:  Ankle fractures are noted. No tibial or fibular shaft fractures. The knee joint is maintained.  Right ankle:  There are trimalleolar ankle fractures. The distal fibular fracture is a spiral type fracture at and above the ankle mortise. There is also a sizable avulsion fracture  distally. Small avulsion fracture involving the distal tip of the medial malleolus and small avulsion fracture involving the posterior tibia. The subtalar joints are maintained. Calcaneal spurs are noted.  Right foot:  No definite acute foot fractures.  IMPRESSION: Trimalleolar ankle fractures as described above.   Electronically Signed   By: Kalman Jewels M.D.   On: 09/22/2014 15:38   Dg Ankle Complete Right  09/22/2014   CLINICAL DATA:  [address] 6 feet and injured right lower extremity  EXAM: RIGHT ANKLE - COMPLETE 3+ VIEW; RIGHT TIBIA AND FIBULA - 2 VIEW; RIGHT FOOT COMPLETE - 3+ VIEW  COMPARISON:  None.  FINDINGS: Right tibia/fibula:  Ankle fractures are noted. No tibial or fibular shaft fractures. The knee joint is maintained.  Right ankle:  There are trimalleolar ankle fractures. The distal fibular fracture is a spiral type fracture at and above the ankle mortise. There is also a sizable avulsion fracture distally. Small avulsion fracture involving the distal tip of the medial malleolus and small avulsion fracture involving the posterior tibia. The subtalar joints are maintained. Calcaneal spurs are noted.  Right foot:  No definite acute foot fractures.  IMPRESSION: Trimalleolar ankle fractures as described above.   Electronically Signed   By: Kalman Jewels M.D.   On: 09/22/2014 15:38   Dg Foot Complete Right  09/22/2014   CLINICAL DATA:  [address] 6 feet and injured right lower extremity  EXAM: RIGHT ANKLE - COMPLETE 3+ VIEW; RIGHT TIBIA AND FIBULA - 2 VIEW; RIGHT FOOT COMPLETE - 3+ VIEW  COMPARISON:  None.  FINDINGS: Right tibia/fibula:  Ankle fractures are noted. No tibial or fibular shaft fractures. The knee joint is maintained.  Right ankle:  There are trimalleolar ankle fractures. The distal fibular fracture is a spiral type fracture at and above the ankle mortise. There is also a sizable avulsion fracture distally. Small avulsion fracture involving the distal tip of the medial malleolus and small  avulsion fracture involving the posterior tibia. The subtalar joints are maintained. Calcaneal spurs are noted.  Right foot:  No definite acute foot fractures.  IMPRESSION: Trimalleolar ankle fractures as described above.   Electronically Signed   By: Kalman Jewels M.D.   On: 09/22/2014 15:38    Review of Systems  All other systems reviewed and are negative.   Blood pressure 144/86,  pulse 81, temperature 98.3 F (36.8 C), temperature source Oral, resp. rate 16, SpO2 100 %. Physical Exam  On examination patient has deformity to the right ankle. Her foot has good capillary refill the foot and ankle were wrapped unable to visualize the skin by report there is no open wounds. Radiographs shows a trimalleolar right ankle fracture. Assessment/Plan Assessment: Unstable trimalleolar right ankle fracture.  Plan: We'll plan for open reduction internal fixation of the right ankle.. Risks and benefits are discussed plan for overnight observation nonweightbearing.  Estella Malatesta V 09/22/2014, 6:22 PM

## 2014-09-22 NOTE — Op Note (Signed)
09/22/2014  7:19 PM  PATIENT:  Katrina Snyder    PRE-OPERATIVE DIAGNOSIS:  Trimalleolar Right Ankle Fracture  POST-OPERATIVE DIAGNOSIS:  Same  PROCEDURE:  OPEN REDUCTION INTERNAL FIXATION (ORIF) ANKLE FRACTURE  SURGEON:  Nadara MustardUDA,MARCUS V, MD  PHYSICIAN ASSISTANT:None ANESTHESIA:   General  PREOPERATIVE INDICATIONS:  Katrina Snyder is a  33 y.o. female with a diagnosis of Trimalleolar Right Ankle Fracture who failed conservative measures and elected for surgical management.    The risks benefits and alternatives were discussed with the patient preoperatively including but not limited to the risks of infection, bleeding, nerve injury, cardiopulmonary complications, the need for revision surgery, among others, and the patient was willing to proceed.  OPERATIVE IMPLANTS: Synthes 7 hole one third tubular plate with locking and compression and lag screws.  OPERATIVE FINDINGS: Displaced Weber B fracture  OPERATIVE PROCEDURE: Patient is a 33 year old woman who slipped down a wet hill sustaining a trimalleolar right ankle fracture. She presents at this time for open reduction internal fixation due to unstable fracture pattern.  Patient was brought to the operating room and underwent a general anesthetic. After adequate levels of anesthesia were obtained patient's right lower extremity was prepped using DuraPrep draped into a sterile field. A timeout was called. A lateral incision was made over the fibula. This was carried down to the fracture site. The fracture edges were freshened irrigated and reduced. This was held stable with a lag screw. A one third tubular plate was contoured applied and an anti-glide fashion compression screws 3 were placed proximally and locking screws 2 were placed distally. The wound was irrigated with normal saline. C-arm fluoroscopy verified alignment. Subcutaneous was closed using 2-0 Vicryl skin was closed using staples. A sterile compressive dressing was  applied. Patient was extubated taken to the PACU in stable condition.

## 2014-09-22 NOTE — ED Notes (Signed)
Per EDP admin Morphine 6mg  and zofran 4mg . This RN pulled from Pyxis with Witness Shawn StallAshley Lassiter, RN

## 2014-09-22 NOTE — Progress Notes (Signed)
Report given to T. Allyson SabalBerry, Scientist, clinical (histocompatibility and immunogenetics)CRNA. She will check about labs.

## 2014-09-23 MED ORDER — OXYCODONE-ACETAMINOPHEN 5-325 MG PO TABS: 1.0000 | ORAL_TABLET | ORAL | Status: AC | PRN

## 2014-09-23 MED ORDER — ASPIRIN EC 325 MG PO TBEC: 325.0000 mg | DELAYED_RELEASE_TABLET | Freq: Every day | ORAL | Status: AC

## 2014-09-23 NOTE — Evaluation (Signed)
Physical Therapy Evaluation Patient Details Name: Katrina Snyder MRN: [id number] DOB: November 30, 1980 Today's Date: 09/23/2014   History of Present Illness  Adm 09/22/14 after slip/fall with Rt trimalleolar ankle fx. 12/31 ORIF PMHX- non-significant  Clinical Impression  Patient is s/p above surgery resulting in functional limitations due to the deficits listed below (see PT Problem List). Pt with severe 10/10 pain with sit EOB that continued to worsen as relaxation techniques, toe wiggling, and elevation of RLE were attempted. Patient writhing and crying in pain and returned to supine with RN called to room to assess pt. He provided IV pain meds. Pt positioned with RLE elevated. Pt lives alone in second floor apartment, however has a brother in [place] and encouraged her to reconsider her d/c plan to enable her to have assistance. Patient will benefit from skilled PT to increase their independence and safety with mobility to allow discharge to the venue listed below.       Follow Up Recommendations CIR;Supervision for mobility/OOB    Equipment Recommendations  Rolling walker with 5" wheels;3in1 (PT) (w/c TBA--? for work, not able to access apt with w/c)    Recommendations for Other Services OT consult     Precautions / Restrictions Precautions Precautions: Fall Required Braces or Orthoses: Other Brace/Splint Other Brace/Splint: cam walker Restrictions Weight Bearing Restrictions: Yes RLE Weight Bearing: Touchdown weight bearing      Mobility  Bed Mobility Overal bed mobility: Needs Assistance Bed Mobility: Supine to Sit;Sit to Supine     Supine to sit: Min guard Sit to supine: Min guard   General bed mobility comments: pt on ICU air bed, therefore close guard to sit EOB (seat deflated); upon sitting, pt with immediate severe pain  Transfers                 General transfer comment: unable to attempt due to pain  Ambulation/Gait                Stairs             Wheelchair Mobility    Modified Rankin (Stroke Patients Only)       Balance                                             Pertinent Vitals/Pain Pain Assessment: 0-10 Pain Score: 10-Worst pain ever (3 before sat EOB) Pain Location: anterior ankle and lower shin Pain Intervention(s): Limited activity within patient's tolerance;Monitored during session;Premedicated before session;Repositioned;Patient requesting pain meds-RN notified;Relaxation;Utilized relaxation techniques;Ice applied    Home Living Family/patient expects to be discharged to:: Private residence Living Arrangements: Alone Available Help at Discharge: Family;Available PRN/intermittently (brother lives local) Type of Home: Apartment (upstairs apt in a house) Home Access: Stairs to enter Entrance Stairs-Rails: None Entrance Stairs-Number of Steps: 1 Home Layout: Two level Home Equipment: Grab bars - tub/shower (tub/shower; regular height toilet)      Prior Function Level of Independence: Independent         Comments: work; [place] [employer] office      Hand Dominance        Extremity/Trunk Assessment   Upper Extremity Assessment: Overall WFL for tasks assessed           Lower Extremity Assessment: RLE deficits/detail RLE Deficits / Details: requires assist of her LUE to move RLE off EOB due to pain; able to slowly lift RLE  back onto bed    Cervical / Trunk Assessment: Normal  Communication   Communication: No difficulties  Cognition Arousal/Alertness: Awake/alert Behavior During Therapy: WFL for tasks assessed/performed Overall Cognitive Status: Within Functional Limits for tasks assessed                      General Comments      Exercises        Assessment/Plan    PT Assessment Patient needs continued PT services  PT Diagnosis Difficulty walking;Acute pain   PT Problem List Decreased activity tolerance;Decreased mobility;Decreased knowledge of  use of DME;Decreased knowledge of precautions;Pain;Obesity  PT Treatment Interventions DME instruction;Gait training;Stair training;Functional mobility training;Therapeutic activities;Patient/family education   PT Goals (Current goals can be found in the Care Plan section) Acute Rehab PT Goals Patient Stated Goal: return home alone PT Goal Formulation: With patient Time For Goal Achievement: 09/30/14 Potential to Achieve Goals: Good    Frequency Min 5X/week   Barriers to discharge Inaccessible home environment;Decreased caregiver support second floor apartment; lives alone    Co-evaluation               End of Session   Activity Tolerance: Patient limited by pain Patient left: in bed;with call bell/phone within reach;with nursing/sitter in room Nurse Communication: Patient requests pain meds;Other (comment) (severe pain with ? concern for compartment syndrome)    Functional Assessment Tool Used: clinical judgement Functional Limitation: Mobility: Walking and moving around Mobility: Walking and Moving Around Current Status (E4540): At least 60 percent but less than 80 percent impaired, limited or restricted Mobility: Walking and Moving Around Goal Status [phone]): At least 1 percent but less than 20 percent impaired, limited or restricted    Time: 0928-1000 PT Time Calculation (min) (ACUTE ONLY): 32 min   Charges:   PT Evaluation $Initial PT Evaluation Tier I: 1 Procedure PT Treatments $Therapeutic Activity: 8-22 mins   PT G Codes:   PT G-Codes **NOT FOR INPATIENT CLASS** Functional Assessment Tool Used: clinical judgement Functional Limitation: Mobility: Walking and moving around Mobility: Walking and Moving Around Current Status (J4782): At least 60 percent but less than 80 percent impaired, limited or restricted Mobility: Walking and Moving Around Goal Status [phone]): At least 1 percent but less than 20 percent impaired, limited or restricted     Marlana Mckowen 09/23/2014 Pager [phone]

## 2014-09-23 NOTE — Progress Notes (Signed)
Orthopedic Tech Progress Note Patient Details:  Katrina Snyder 02-11-1981 [id number]  Patient ID: Katrina Snyder, female   DOB: 1981/07/15, 34 y.o.   MRN: [id number] As ordered by Dr. Myrtie Soman, Katrina Snyder 09/23/2014, 8:14 AM

## 2014-09-23 NOTE — Progress Notes (Signed)
Orthopedic Tech Progress Note Patient Details:  MARTHE DANT [birth year] [id number] Splint charges deleted, splint not needed. Patient ID: Rivka Safer, female   DOB: [birth year], 34 y.o.   MRN: [id number]   Jennye Moccasin 09/23/2014, 10:38 PM

## 2014-09-23 NOTE — Discharge Summary (Signed)
Physician Discharge Summary  Patient ID: Katrina Snyder MRN: [id number] DOB/AGE: [birth year] 34 y.o.  Admit date: 09/22/2014 Discharge date: 09/23/2014  Admission Diagnoses: Trimalleolar right ankle fracture closed.  Discharge Diagnoses:  Active Problems:   Trimalleolar fracture of ankle, closed   Discharged Condition: stable  Hospital Course: Patient's hospital course was essentially unremarkable. She was admitted underwent open reduction internal fixation of the right ankle. Postoperatively she progressed well and was discharged to home in stable condition.  Consults: None  Significant Diagnostic Studies: labs: Routine labs  Treatments: surgery: See operative note  Discharge Exam: Blood pressure 124/64, pulse 95, temperature 98.2 F (36.8 C), temperature source Oral, resp. rate 16, SpO2 99 %. Incision/Wound: dressing clean dry and intact  Disposition: 01-Home or Self Care  Discharge Instructions    Call MD / Call 911    Complete by:  As directed   If you experience chest pain or shortness of breath, CALL 911 and be transported to the hospital emergency room.  If you develope a fever above 101 F, pus (white drainage) or increased drainage or redness at the wound, or calf pain, call your surgeon's office.     Constipation Prevention    Complete by:  As directed   Drink plenty of fluids.  Prune juice may be helpful.  You may use a stool softener, such as Colace (over the counter) 100 mg twice a day.  Use MiraLax (over the counter) for constipation as needed.     Diet - low sodium heart healthy    Complete by:  As directed      Elevate operative extremity    Complete by:  As directed      Increase activity slowly as tolerated    Complete by:  As directed      Touch down weight bearing    Complete by:  As directed   Laterality:  right  Extremity:  Lower            Medication List    TAKE these medications        aspirin EC 325 MG tablet  Take 1 tablet (325 mg  total) by mouth daily.     dexamethasone 0.1 % ophthalmic suspension  Commonly known as:  DECADRON  Place 1 drop into both eyes 2 (two) times daily.     ibuprofen 200 MG tablet  Commonly known as:  ADVIL,MOTRIN  Take 400 mg by mouth every 6 (six) hours as needed for moderate pain.     multivitamin with minerals Tabs tablet  Take 1 tablet by mouth daily.     oxyCODONE-acetaminophen 5-325 MG per tablet  Commonly known as:  ROXICET  Take 1 tablet by mouth every 4 (four) hours as needed for severe pain.     trimethoprim-polymyxin b ophthalmic solution  Commonly known as:  POLYTRIM  Place 1 drop into both eyes every 4 (four) hours.           Follow-up Information    Follow up with Jihaad Bruschi V, MD In 1 week.   Specialty:  Orthopedic Surgery   Contact information:   [address] [place] [address] [phone]       Signed: Nadara Mustard 09/23/2014, 8:27 AM

## 2014-09-23 NOTE — Progress Notes (Signed)
Physical Therapy Treatment Patient Details Name: Katrina Snyder MRN: [id number] DOB: Feb 01, 1981 Today's Date: 09/23/2014    History of Present Illness Adm 09/22/14 after slip/fall with Rt trimalleolar ankle fx. 12/31 ORIF PMHX- non-significant    PT Comments    Pt had IV pain medicine and another dose of po pain medicine prior to second PT session. At rest, pain 0/10. With ambulation/dependent position, pain quickly incr to 8/10. Pt able to only walk 12 ft twice (seated rest between) and currently unable to initiate stair training due to pain (Pt lives in second floor apartment alone). Encouraged pt to discuss staying with her brother at his home (level entry, bedrooms are upstairs--? Stay on the couch until able to ascend steps). She plans to talk to him this afternoon. Will re-address discharge plan and equipment needs 09/24/13. Recommend OT Consult for education re: independence with ADLs.   Follow Up Recommendations  CIR;Supervision for mobility/OOB (encouraged pt to discuss plan with brother--possibly home to his house with intermittent assist and HHPT)     Equipment Recommendations  Rolling walker with 5" wheels;3in1 (PT) (w/c with cushionTBA--? for work, community needs)    Recommendations for [employer] OT consult     Precautions / Restrictions Precautions Precautions: Fall Required Braces or Orthoses: Other Brace/Splint Other Brace/Splint: cam walker Restrictions RLE Weight Bearing: Touchdown weight bearing    Mobility  Bed Mobility Overal bed mobility: Needs Assistance Bed Mobility: Supine to Sit;Sit to Supine     Supine to sit: Min guard Sit to supine: Min guard   General bed mobility comments: pt on ICU air bed, therefore close guard to sit EOB (seat deflated)  Transfers Overall transfer level: Needs assistance Equipment used: Rolling walker (2 wheeled) Transfers: Sit to/from Stand Sit to Stand: Min assist         General transfer comment: vc for  safe use of RW and technique to maintain TDWB; x 2  Ambulation/Gait Ambulation/Gait assistance: Min assist Ambulation Distance (Feet): 12 Feet (seated rest, 66ft again) Assistive device: Rolling walker (2 wheeled) Gait Pattern/deviations: Step-to pattern     General Gait Details: poor use of UEs to slow her "landing" when hopping on Lt foot; the jarring upon landing with incr pain in Rt ankle (pt is maintaining TDWB RLE)   Stairs Stairs:  (unable due to pain and limited ambulation)          Wheelchair Mobility    Modified Rankin (Stroke Patients Only)       Balance                                    Cognition Arousal/Alertness: Awake/alert Behavior During Therapy: WFL for tasks assessed/performed Overall Cognitive Status: Within Functional Limits for tasks assessed                      Exercises Other Exercises Other Exercises: Educated to continue elevation and toe wiggling to help reduce edema. Educated to continue to work on RLE [employer] comments (skin integrity, edema, etc.): Cam walker donned for pt; verbally explained process      Pertinent Vitals/Pain Pain Assessment: 0-10 Pain Score: 8  Pain Location: Rt ankle Pain Intervention(s): Limited activity within patient's tolerance;Monitored during session;Premedicated before session;Repositioned;Relaxation    Home Living Family/patient expects to be discharged to:: Private residence Living Arrangements: Alone Available Help at Discharge: Family;Available PRN/intermittently (brother lives  local) Type of Home: Apartment (upstairs apt in a house) Home Access: Stairs to enter Entrance Stairs-Rails: None Home Layout: Two level Home Equipment: Grab bars - tub/shower (tub/shower; regular height toilet)      Prior Function Level of Independence: Independent      Comments: work; [place] [employer] office    PT Goals (current goals can now be found in the care plan  section) Acute Rehab PT Goals Patient Stated Goal: return home alone PT Goal Formulation: With patient Time For Goal Achievement: 09/30/14 Potential to Achieve Goals: Good Progress towards PT goals: Progressing toward goals    Frequency  Min 5X/week    PT Plan Current plan remains appropriate    Co-evaluation             End of Session Equipment Utilized During Treatment: Gait belt Activity Tolerance: Patient limited by pain Patient left: with call bell/phone within reach;with nursing/sitter in room;in chair     Time: 1131-1155 PT Time Calculation (min) (ACUTE ONLY): 24 min  Charges:  $Gait Training: 23-37 mins $Therapeutic Activity: 8-22 mins                    G Codes:  Functional Assessment Tool Used: clinical judgement Functional Limitation: Mobility: Walking and moving around Mobility: Walking and Moving Around Current Status [phone]): At least 60 percent but less than 80 percent impaired, limited or restricted Mobility: Walking and Moving Around Goal Status [phone]): At least 1 percent but less than 20 percent impaired, limited or restricted   Vivien Barretto 09/23/2014, 1:07 PM Pager [phone]

## 2014-09-23 NOTE — Progress Notes (Signed)
Patient has not been able to fully participate with physical therapy because of pain issues.  Dr. Lajoyce Corners notified, discharge cancelled for today, 09/23/14, nursing to continue to monitor.

## 2014-09-23 NOTE — Progress Notes (Signed)
Orthopedic Tech Progress Note Patient Details:  Katrina Snyder [birth year] [id number]  Ortho Devices Type of Ortho Device: CAM walker Splint Material: Fiberglass Ortho Device/Splint Location: rle Ortho Device/Splint Interventions: Application   Katrina Snyder 09/23/2014, 8:14 AM

## 2014-09-24 NOTE — Care Management Note (Signed)
    Page 1 of 2   09/24/2014     1:21:03 PM CARE MANAGEMENT NOTE 09/24/2014  Patient:  Katrina Snyder, Katrina Snyder   Account Number:  [id number]  Date Initiated:  09/24/2014  Documentation initiated by:  [email or web address]  Subjective/Objective Assessment:   Ankle Fracture     Action/Plan:   CM consult   Anticipated DC Date:  09/24/2014   Anticipated DC Plan:  HOME W HOME HEALTH SERVICES      DC Planning Services  CM consult      Choice offered to / List presented to:  C-1 Patient   DME arranged  3-N-1      DME agency  [facility]     HH arranged  HH-2 PT      [facility] agency  [facility]   Status of service:  Completed, signed off Medicare Important Message given?   (If response is "NO", the following Medicare IM given date fields will be blank) Date Medicare IM given:   Medicare IM given by:   Date Additional Medicare IM given:   Additional Medicare IM given by:    Discharge Disposition:    Per UR Regulation:    If discussed at Long Length of Stay Meetings, dates discussed:    Comments:  1.2.2016 Gerlene Fee / BSN 12.30pm     Role of case manager explained.Patient verbalizes her understanding.Patient reports she has [facility] [employer] and is in the process of establishing  a PCP. Patient proivided with CHOICE LIST for home health services and  Elected [facility].CM called [facility] rep and ordered DME 3 IN 1 and a rolling walker.CM called [facility] And  Placed a referal for PT services CM spoke with Lacretia at [facility] to set this up.Patient verbalizes her  Understanding. No further CM needs at this time.

## 2014-09-24 NOTE — Discharge Summary (Signed)
  Patient's discharge was delayed secondary to her inability to ambulate independently. Patient received additional physical therapy today and anticipate discharge to home after therapy. Final diagnosis trimalleolar ankle fracture closed surgical treatment open reduction internal fixation. Follow-up in the office in one week

## 2014-09-24 NOTE — Progress Notes (Signed)
Physical Therapy Treatment Patient Details Name: Katrina Snyder MRN: [id number] DOB: December 10, 1980 Today's Date: 09/24/2014    History of Present Illness Adm 09/22/14 after slip/fall with Rt trimalleolar ankle fx. 12/31 ORIF PMHX- non-significant    PT Comments    Pt reported that brothers apartment also has 12 stairs to enter and is multiple levels therefore she wants to return home alone. Pt however currently unable to tolerate R LE in dependent position limiting any OOB mobility let alone negotiating 12 steps to enter her or her brothers apartment. Pt and RN staff instructed to no longer use bed pan and to transfer to/from [facility] as well as lower R LE t/o the day to help increased R LE tolerance in dependent position. Pt reports "I didn't know I should be doing all this or else I would've."  Pt was in tears due to intense throbbing pain however pt motivated to return home tomorrow and reports to be compliant with HEP. RN aware. PT to see pt 1/3 AM for stair training.    Follow Up Recommendations  CIR;Supervision for mobility/OOB     Equipment Recommendations  Rolling walker with 5" wheels;3in1 (PT)    Recommendations for Other Services OT consult     Precautions / Restrictions Precautions Precautions: Fall Required Braces or Orthoses: Other Brace/Splint Other Brace/Splint: cam walker Restrictions Weight Bearing Restrictions: Yes RLE Weight Bearing: Touchdown weight bearing    Mobility  Bed Mobility Overal bed mobility: Needs Assistance Bed Mobility: Supine to Sit     Supine to sit: Min assist     General bed mobility comments: minA for R LE, pt unable to tolerate moving R LE into dep. position, encouraged to take deep breaths and wiggle toes  Transfers Overall transfer level: Needs assistance Equipment used: Rolling walker (2 wheeled) Transfers: Sit to/from [employer] Sit to Stand: Min assist Stand pivot transfers: Min assist       General  transfer comment: pt in tears due to pain but able to hop x 3 to chair with RW  Ambulation/Gait             General Gait Details: unable due to pain this date   Stairs            Wheelchair Mobility    Modified Rankin (Stroke Patients Only)       Balance                                    Cognition Arousal/Alertness: Awake/alert Behavior During Therapy: WFL for tasks assessed/performed Overall Cognitive Status: Within Functional Limits for tasks assessed                      Exercises      General Comments General comments (skin integrity, edema, etc.): spoke extensively with patient about reality of returning home alone. RN present and called Dr. Lajoyce Corners to relay pt's significant pain. Pt instructed to lower leg into dep. position t/o the day to start to get used to it      Pertinent Vitals/Pain Pain Assessment: 0-10 Pain Score: 10-Worst pain ever (1/10 supine with R LE elevated, 10/10 in dep. position) Pain Location: Rt ankle Pain Descriptors / Indicators: Throbbing Pain Intervention(s): Limited activity within patient's tolerance;RN gave pain meds during session;Patient requesting pain meds-RN notified;Ice applied;Relaxation;Utilized relaxation techniques;Repositioned    Home Living  Prior Function            PT Goals (current goals can now be found in the care plan section) Acute Rehab PT Goals Patient Stated Goal: home today Progress towards PT goals: Progressing toward goals    Frequency  Min 5X/week    PT Plan Current plan remains appropriate    Co-evaluation             End of Session Equipment Utilized During Treatment: Gait belt Activity Tolerance: Patient limited by pain Patient left: in chair;with call bell/phone within reach;with family/visitor present     Time: 1610-9604 PT Time Calculation (min) (ACUTE ONLY): 38 min  Charges:  $Therapeutic Activity: 23-37 mins $Self  Care/Home Management: 06/12/23                    G Codes:      Marcene Brawn 09/24/2014, 4:52 PM   Lewis Shock, PT, DPT Pager #: [phone] Office #: [phone]

## 2014-09-25 NOTE — Progress Notes (Signed)
Physical Therapy Treatment Patient Details Name: Katrina Snyder MRN: [id number] DOB: Jun 06, 1981 Today's Date: 09/25/2014    History of Present Illness Adm 09/22/14 after slip/fall with Rt trimalleolar ankle fx. 12/31 ORIF PMHX- non-significant    PT Comments    Pt continues to be limited by pain but was able to ambulate to/from bathroom with supervision only for safety. Pt demo good ability to balance and maintaining a NWB status due to pain. Educated on HEP, stair transfer technique and car transfer technique. Pt is safe from PT standpoint to D/C home with 24/7 (A) today.   Follow Up Recommendations  Supervision for mobility/OOB;Home health PT     Equipment Recommendations  Rolling walker with 5" wheels;3in1 (PT);Other (comment) (may need w/c with elevated leg rest for incr distance )    Recommendations for Other Services       Precautions / Restrictions Precautions Precautions: Fall Required Braces or Orthoses: Other Brace/Splint Other Brace/Splint: cam walker- pt could not tolerate strapping CAM walker on today Restrictions Weight Bearing Restrictions: Yes RLE Weight Bearing: Touchdown weight bearing    Mobility  Bed Mobility Overal bed mobility: Modified Independent Bed Mobility: Supine to Sit     Supine to sit: HOB elevated;Supervision     General bed mobility comments: incr time due to pain  Transfers Overall transfer level: Needs assistance Equipment used: Rolling walker (2 wheeled) Transfers: Sit to/from Stand Sit to Stand: Supervision         General transfer comment: supervision for safety and min cues for technique/hand placement   Ambulation/Gait Ambulation/Gait assistance: Supervision Ambulation Distance (Feet): 30 Feet (15'x2) Assistive device: Rolling walker (2 wheeled) Gait Pattern/deviations: Step-to pattern Gait velocity: decreased Gait velocity interpretation: Below normal speed for age/gender General Gait Details: supervision for  safety; pt limited to short distance ambulation due to pain; no LOB noted; demo good technique and ability to maintain NWB status on Rt LE    Stairs Stairs: Yes   Stair Management: No rails   General stair comments: verbally and visually educated pt on techniques; pt unable to practice due to pain; recommend use of bump up technique on bottom for safety and to have brother with her for (A); pt able to verbalize back understanding  Wheelchair Mobility    Modified Rankin (Stroke Patients Only)       Balance Overall balance assessment: Needs assistance Sitting-balance support: Feet supported;No upper extremity supported Sitting balance-Leahy Scale: Fair Sitting balance - Comments: guarded due to pain    Standing balance support: During functional activity;Bilateral upper extremity supported Standing balance-Leahy Scale: Poor Standing balance comment: relying on RW                    Cognition Arousal/Alertness: Awake/alert Behavior During Therapy: WFL for tasks assessed/performed Overall Cognitive Status: Within Functional Limits for tasks assessed                      Exercises Other Exercises Other Exercises: encouraged SLR and LAQ as tolerated     General Comments General comments (skin integrity, edema, etc.): educated on car transfer technique       Pertinent Vitals/Pain Pain Assessment: 0-10 Pain Score: 10-Worst pain ever Pain Location: Rt ankle with dangling off BED Pain Descriptors / Indicators: Spasm;Throbbing Pain Intervention(s): Monitored during session;Limited activity within patient's tolerance;Patient requesting pain meds-RN notified;Repositioned;Ice applied    Home Living  Prior Function            PT Goals (current goals can now be found in the care plan section) Acute Rehab PT Goals Patient Stated Goal: to go home today PT Goal Formulation: With patient Time For Goal Achievement: 09/30/14 Potential  to Achieve Goals: Good Progress towards PT goals: Progressing toward goals    Frequency  Min 5X/week    PT Plan Current plan remains appropriate    Co-evaluation             End of Session Equipment Utilized During Treatment: Gait belt Activity Tolerance: Patient limited by pain Patient left: in chair;with call bell/phone within reach;with family/visitor present     Time: 0823-0849 PT Time Calculation (min) (ACUTE ONLY): 26 min  Charges:  $Gait Training: 23-37 mins                    G Codes:  Functional Assessment Tool Used: clinical judgement Functional Limitation: Mobility: Walking and moving around Mobility: Walking and Moving Around Current Status [phone]): At least 1 percent but less than 20 percent impaired, limited or restricted Mobility: Walking and Moving Around Goal Status [phone]): At least 1 percent but less than 20 percent impaired, limited or restricted Mobility: Walking and Moving Around Discharge Status [phone]): At least 1 percent but less than 20 percent impaired, limited or restricted   Donell Sievert, [place]  [phone] 09/25/2014, 8:58 AM

## 2014-09-25 NOTE — Progress Notes (Signed)
Patient ID: Katrina Snyder, female   DOB: Jul 12, 1981, 34 y.o.   MRN: [id number] Patient is a 34 year old woman who is been slow to progress with therapy. Patient feels like she should BLD discharge to home today. I will plan to follow-up in the office in one week.

## 2014-09-26 ENCOUNTER — Encounter (HOSPITAL_COMMUNITY): Payer: Self-pay | Admitting: Orthopedic Surgery

## 2014-09-28 ENCOUNTER — Encounter (HOSPITAL_COMMUNITY): Payer: Self-pay | Admitting: Emergency Medicine

## 2014-09-28 ENCOUNTER — Emergency Department (HOSPITAL_COMMUNITY)
Admission: EM | Admit: 2014-09-28 | Discharge: 2014-09-28 | Disposition: A | Discharge status: Home / Self Care | Payer: PRIVATE HEALTH INSURANCE | Attending: Emergency Medicine | Admitting: Emergency Medicine

## 2014-09-28 ENCOUNTER — Emergency Department (HOSPITAL_COMMUNITY): Payer: PRIVATE HEALTH INSURANCE

## 2014-09-28 DIAGNOSIS — Z79899 Other long term (current) drug therapy: Secondary | ICD-10-CM | POA: Insufficient documentation

## 2014-09-28 DIAGNOSIS — Z9889 Other specified postprocedural states: Secondary | ICD-10-CM | POA: Diagnosis not present

## 2014-09-28 DIAGNOSIS — Z8679 Personal history of other diseases of the circulatory system: Secondary | ICD-10-CM | POA: Insufficient documentation

## 2014-09-28 DIAGNOSIS — Z7952 Long term (current) use of systemic steroids: Secondary | ICD-10-CM | POA: Insufficient documentation

## 2014-09-28 DIAGNOSIS — W1839XS Other fall on same level, sequela: Secondary | ICD-10-CM | POA: Insufficient documentation

## 2014-09-28 DIAGNOSIS — W19XXXA Unspecified fall, initial encounter: Secondary | ICD-10-CM

## 2014-09-28 DIAGNOSIS — Z7982 Long term (current) use of aspirin: Secondary | ICD-10-CM | POA: Diagnosis not present

## 2014-09-28 DIAGNOSIS — S8251XS Displaced fracture of medial malleolus of right tibia, sequela: Secondary | ICD-10-CM | POA: Insufficient documentation

## 2014-09-28 DIAGNOSIS — M25571 Pain in right ankle and joints of right foot: Secondary | ICD-10-CM | POA: Diagnosis present

## 2014-09-28 DIAGNOSIS — E669 Obesity, unspecified: Secondary | ICD-10-CM | POA: Insufficient documentation

## 2014-09-28 DIAGNOSIS — S82401S Unspecified fracture of shaft of right fibula, sequela: Secondary | ICD-10-CM

## 2014-09-28 MED ORDER — ONDANSETRON 4 MG PO TBDP
4.0000 mg | ORAL_TABLET | Freq: Once | ORAL | Status: AC
  Administered 2014-09-28: 4 mg via ORAL
  Filled 2014-09-28: qty 1

## 2014-09-28 MED ORDER — HYDROMORPHONE HCL 2 MG PO TABS: 2.0000 mg | ORAL_TABLET | ORAL | Status: AC | PRN

## 2014-09-28 MED ORDER — DOCUSATE SODIUM 100 MG PO CAPS: 100.0000 mg | ORAL_CAPSULE | Freq: Two times a day (BID) | ORAL | Status: AC

## 2014-09-28 MED ORDER — HYDROMORPHONE HCL 2 MG/ML IJ SOLN
2.0000 mg | Freq: Once | INTRAMUSCULAR | Status: AC
  Administered 2014-09-28: 2 mg via INTRAMUSCULAR
  Filled 2014-09-28: qty 1

## 2014-09-28 NOTE — ED Notes (Signed)
Pt had surgery on dec 31 for a fracture to leg. Pt states the day she was released on Jan 2  she had a fall in apartment since then right leg and ankle have been increasing in pain.

## 2014-09-28 NOTE — Discharge Instructions (Signed)
Fibular Fracture, Ankle, Adult, Undisplaced, Treated With Immobilization A simple fracture of the bone below the knee on the outside of your leg (fibula) usually heals without problems. CAUSES Typically, a fibular fracture occurs as a result of trauma. A blow to the side of your leg or a powerful twisting movement can cause a fracture. Fibular fractures are often seen as a result of football, soccer, or skiing injuries. SYMPTOMS Symptoms of a fibular fracture can include:  Pain.  Shortening or abnormal alignment of your lower leg (angulation). DIAGNOSIS A health care provider will need to examine the leg. X-ray exams will be ordered for further to confirm the fracture and evaluate the extent and of the injury. TREATMENT  Typically, a cast or immobilizer is applied. Sometimes a splint is placed on these fractures if it is needed for comfort or if the bones are badly out of place. Crutches may be needed to help you get around.  HOME CARE INSTRUCTIONS   Apply ice to the injured area:  Put ice in a plastic bag.  Place a towel between your skin and the bag.  Leave the ice on for 20 minutes, 2-3 times a day.  Use crutches as directed. Resume walking without crutches as directed by your health care provider or when comfortable doing so.  Only take over-the-counter or prescription medicines for pain, discomfort, or fever as directed by your health care provider.  Keeping your leg raised may lessen swelling.  If you have a removable splint or boot, do not remove the boot unless directed by your health care provider.  Do not not drive a car or operate a motor vehicle until your health care provider specifically tells you it is safe to do so. SEEK IMMEDIATE MEDICAL CARE IF:   Your cast gets damaged or breaks.  You have continued severe pain or more swelling than you did before the cast was put on, or the pain is not controlled with medications.  Your skin or nails below the injury turn  blue or grey, or feel cold or numb.  There is a bad smell or pus coming from under the cast.  You develop severe pain in ankle or foot. MAKE SURE YOU:   Understand these instructions.  Will watch your condition.  Will get help right away if you are not doing well or get worse. Document Released: 06/01/2002 Document Revised: 06/30/2013 Document Reviewed: 04/21/2013 [facility] Patient Information 2015 [place], [place]. This information is not intended to replace advice given to you by your health care provider. Make sure you discuss any questions you have with your health care provider.     RICE: Routine Care for Injuries The routine care of many injuries includes Rest, Ice, Compression, and Elevation (RICE). HOME CARE INSTRUCTIONS  Rest is needed to allow your body to heal. Routine activities can usually be resumed when comfortable. Injured tendons and bones can take up to 6 weeks to heal. Tendons are the cord-like structures that attach muscle to bone.  Ice following an injury helps keep the swelling down and reduces pain.  Put ice in a plastic bag.  Place a towel between your skin and the bag.  Leave the ice on for 15-20 minutes, 3-4 times a day, or as directed by your health care provider. Do this while awake, for the first 24 to 48 hours. After that, continue as directed by your caregiver.  Compression helps keep swelling down. It also gives support and helps with discomfort. If an elastic bandage  has been applied, it should be removed and reapplied every 3 to 4 hours. It should not be applied tightly, but firmly enough to keep swelling down. Watch fingers or toes for swelling, bluish discoloration, coldness, numbness, or excessive pain. If any of these problems occur, remove the bandage and reapply loosely. Contact your caregiver if these problems continue.  Elevation helps reduce swelling and decreases pain. With extremities, such as the arms, hands, legs, and feet, the injured  area should be placed near or above the level of the heart, if possible. SEEK IMMEDIATE MEDICAL CARE IF:  You have persistent pain and swelling.  You develop redness, numbness, or unexpected weakness.  Your symptoms are getting worse rather than improving after several days. These symptoms may indicate that further evaluation or further X-rays are needed. Sometimes, X-rays may not show a small broken bone (fracture) until 1 week or 10 days later. Make a follow-up appointment with your caregiver. Ask when your X-ray results will be ready. Make sure you get your X-ray results. Document Released: 12/22/2000 Document Revised: 09/14/2013 Document Reviewed: 02/08/2011 [facility] Patient Information 2015 [place], [place]. This information is not intended to replace advice given to you by your health care provider. Make sure you discuss any questions you have with your health care provider.

## 2014-09-28 NOTE — ED Provider Notes (Signed)
TIME SEEN: 2:40 PM  CHIEF COMPLAINT: Right leg pain  HPI: Pt is a 34 y.o. female with history of obesity, migraines who presents to the emergency department with complaints of right leg pain. Patient was seen in the emergency department on 09/22/14 after she had a fall down an incline and was noted to have a trimalleolar fracture of the right leg. She was taken to the operating room for open reduction internal fixation with Dr. Lajoyce Cornersuda that same day. States that after surgery she was having pain in the right lateral leg. States that her pain has never been well controlled. She is on Percocet at home taking 25 mg tablets every 4 hours without relief of pain. She states that 3 days ago she did have a fall and injured her right leg again. No new numbness, weakness. No fever.  ROS: See HPI Constitutional: no fever  Eyes: no drainage  ENT: no runny nose   Cardiovascular:  no chest pain  Resp: no SOB  GI: no vomiting GU: no dysuria Integumentary: no rash  Allergy: no hives  Musculoskeletal: no leg swelling  Neurological: no slurred speech ROS otherwise negative  PAST MEDICAL HISTORY/PAST SURGICAL HISTORY:  History reviewed. No pertinent past medical history.  MEDICATIONS:  Prior to Admission medications   Medication Sig Start Date End Date Taking? Authorizing Provider  FIBER PO Take 5 tablets by mouth daily.   Yes Historical Provider, MD  ibuprofen (ADVIL,MOTRIN) 200 MG tablet Take 400 mg by mouth every 6 (six) hours as needed for moderate pain.   Yes Historical Provider, MD  Multiple Vitamin (MULTIVITAMIN WITH MINERALS) TABS tablet Take 1 tablet by mouth daily.   Yes Historical Provider, MD  naproxen sodium (ANAPROX) 220 MG tablet Take 440 mg by mouth 2 (two) times daily as needed (pain).   Yes Historical Provider, MD  oxyCODONE-acetaminophen (ROXICET) 5-325 MG per tablet Take 1 tablet by mouth every 4 (four) hours as needed for severe pain. Patient taking differently: Take 2 tablets by mouth  every 4 (four) hours as needed for severe pain.  09/23/14  Yes Nadara MustardMarcus Duda V, MD  aspirin EC 325 MG tablet Take 1 tablet (325 mg total) by mouth daily. Patient not taking: Reported on 09/28/2014 09/23/14   Nadara MustardMarcus Duda V, MD  dexamethasone (DECADRON) 0.1 % ophthalmic suspension Place 1 drop into both eyes 2 (two) times daily. Patient not taking: Reported on 09/22/2014 05/02/14   Hayden Rasmussenavid Mabe, NP  trimethoprim-polymyxin b (POLYTRIM) ophthalmic solution Place 1 drop into both eyes every 4 (four) hours. Patient not taking: Reported on 09/22/2014 05/02/14   Hayden Rasmussenavid Mabe, NP    ALLERGIES:  Allergies  Allergen Reactions  . Imitrex [Sumatriptan] Shortness Of Breath    SOCIAL HISTORY:  History  Substance Use Topics  . Smoking status: Never Smoker   . Smokeless tobacco: Not on file  . Alcohol Use: Yes     Comment: Occasional    FAMILY HISTORY: No family history on file.  EXAM: BP 131/85 mmHg  Pulse 128  Temp(Src) 98.5 F (36.9 C) (Oral)  Resp 20  SpO2 100% CONSTITUTIONAL: Alert and oriented and responds appropriately to questions. Appears very uncomfortable but is nontoxic HEAD: Normocephalic EYES: Conjunctivae clear, PERRL ENT: normal nose; no rhinorrhea; moist mucous membranes; pharynx without lesions noted NECK: Supple, no meningismus, no LAD  CARD: Regular and tachycardic; S1 and S2 appreciated; no murmurs, no clicks, no rubs, no gallops RESP: Normal chest excursion without splinting or tachypnea; breath sounds clear and equal  bilaterally; no wheezes, no rhonchi, no rales, no hypoxia or respiratory distress ABD/GI: Normal bowel sounds; non-distended; soft, non-tender, no rebound, no guarding BACK:  The back appears normal and is non-tender to palpation, there is no CVA tenderness EXT: Extremely tender to palpation over the right lateral calf above her incision site, there is no edema or warmth or induration or crepitus; there is no drainage or bleeding from her incision. She has staples that  are placed in the incision site of the right lateral ankle. She has significant edema of her foot, ankle and distal calf on the right side but compartments are soft, 2+ DP pulses, normal sensation diffusely, decreased range of motion in the right ankle secondary to pain but otherwise Normal ROM in all joints; no joint effusions. Otherwise extremities are non-tender to palpation; normal capillary refill; no cyanosis   SKIN: Normal color for age and race; warm NEURO: Moves all extremities equally PSYCH: The patient's mood and manner are appropriate. Grooming and personal hygiene are appropriate.  MEDICAL DECISION MAKING: Patient here with uncontrolled pain after right ankle surgery. No sign of infection on exam. She is neurovascularly intact distally. We'll give IM Dilaudid for pain. Given she did have a new fall 3 days ago will obtain x-rays of the right tibia, fibula and ankle. No other injury on exam.  ED PROGRESS: X-ray show a stable avulsion fracture of the posterior tibia, stable fracture of the medial malleolus.  There is a new mild displaced fracture of the distal shaft of the right fibula below the most superior screw with some extension of the fracture line above the metallic fixation plate. We'll discuss with Dr. Lajoyce Corners.   4:17 PM  D/w Dr. Roda Shutters.  Patient has an appointment scheduled for tomorrow morning at 9:30 AM.  Will re-wrap wound and placed back in a [place] Walker and have her follow-up with her orthopedist tomorrow morning. We'll discharge with prescription for Dilaudid. Chest the importance of rest, elevation and ice. Discussed return precautions. Patient verbalizes understanding and is comfortable with plan.  Layla Maw Deeann Servidio, DO 09/28/14 [phone]

## 2015-01-12 ENCOUNTER — Ambulatory Visit (INDEPENDENT_AMBULATORY_CARE_PROVIDER_SITE_OTHER): Payer: PRIVATE HEALTH INSURANCE | Admitting: Clinical

## 2015-01-12 ENCOUNTER — Encounter (HOSPITAL_COMMUNITY): Payer: Self-pay | Admitting: Clinical

## 2015-01-12 ENCOUNTER — Encounter (INDEPENDENT_AMBULATORY_CARE_PROVIDER_SITE_OTHER): Payer: Self-pay

## 2015-01-12 DIAGNOSIS — F4321 Adjustment disorder with depressed mood: Secondary | ICD-10-CM | POA: Diagnosis not present

## 2015-01-12 DIAGNOSIS — F329 Major depressive disorder, single episode, unspecified: Secondary | ICD-10-CM

## 2015-01-12 DIAGNOSIS — F411 Generalized anxiety disorder: Secondary | ICD-10-CM

## 2015-01-12 DIAGNOSIS — F410 Panic disorder [episodic paroxysmal anxiety] without agoraphobia: Secondary | ICD-10-CM

## 2015-01-12 DIAGNOSIS — F32A Depression, unspecified: Secondary | ICD-10-CM
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
## 2015-02-02 ENCOUNTER — Ambulatory Visit (HOSPITAL_COMMUNITY): Payer: Self-pay | Admitting: Clinical

## 2016-02-01 IMAGING — CR DG ANKLE COMPLETE 3+V*R*
3 series · 3 of 3 positions shown · non-contrast
Comparison: 09/22/2014

CLINICAL DATA: Right ankle pain post fall, did have a fracture with
surgery right ankle 09/22/2014, fell again couple days ago

EXAM:
RIGHT ANKLE - COMPLETE 3+ VIEW

[x ankle lat right]
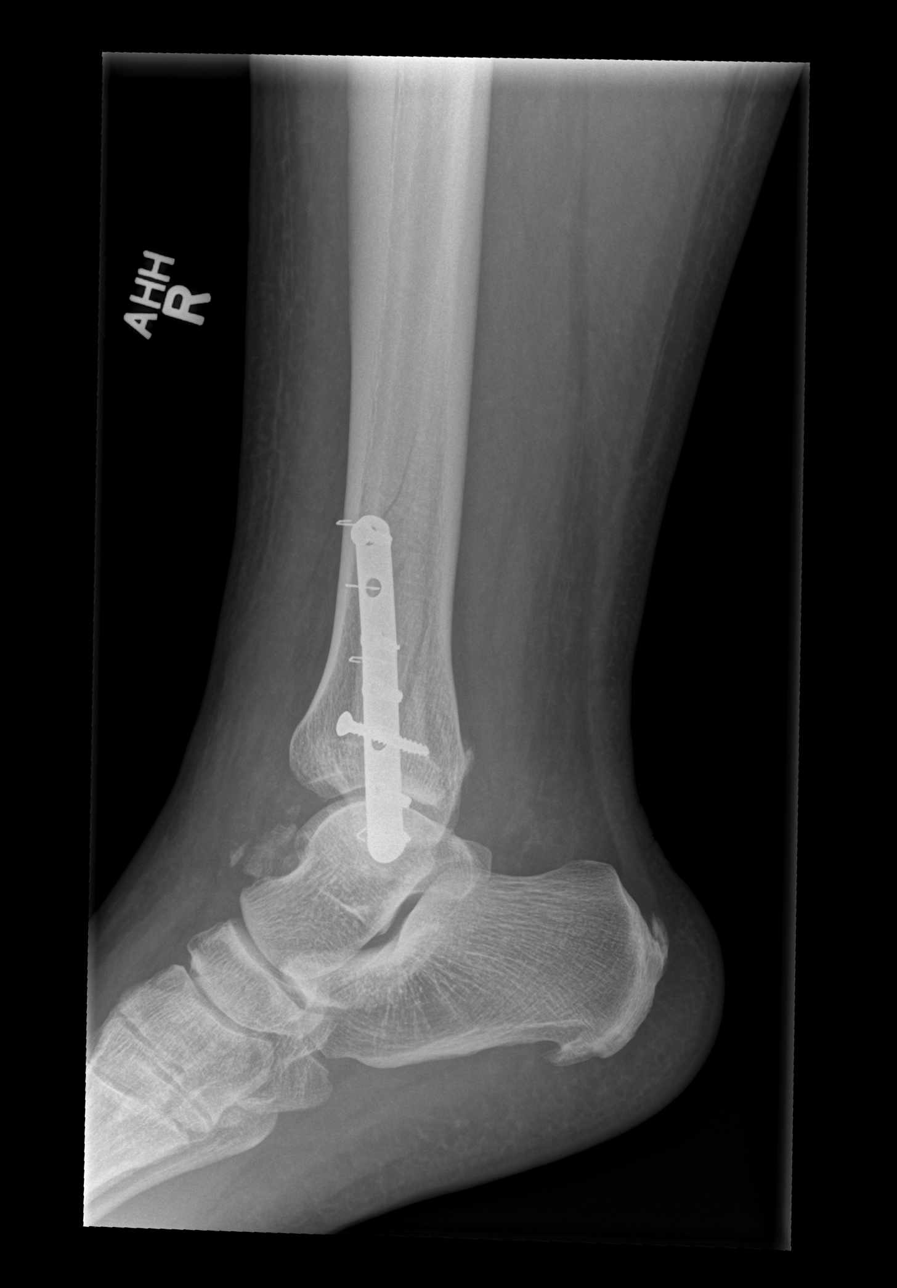

[x ankle ap right]
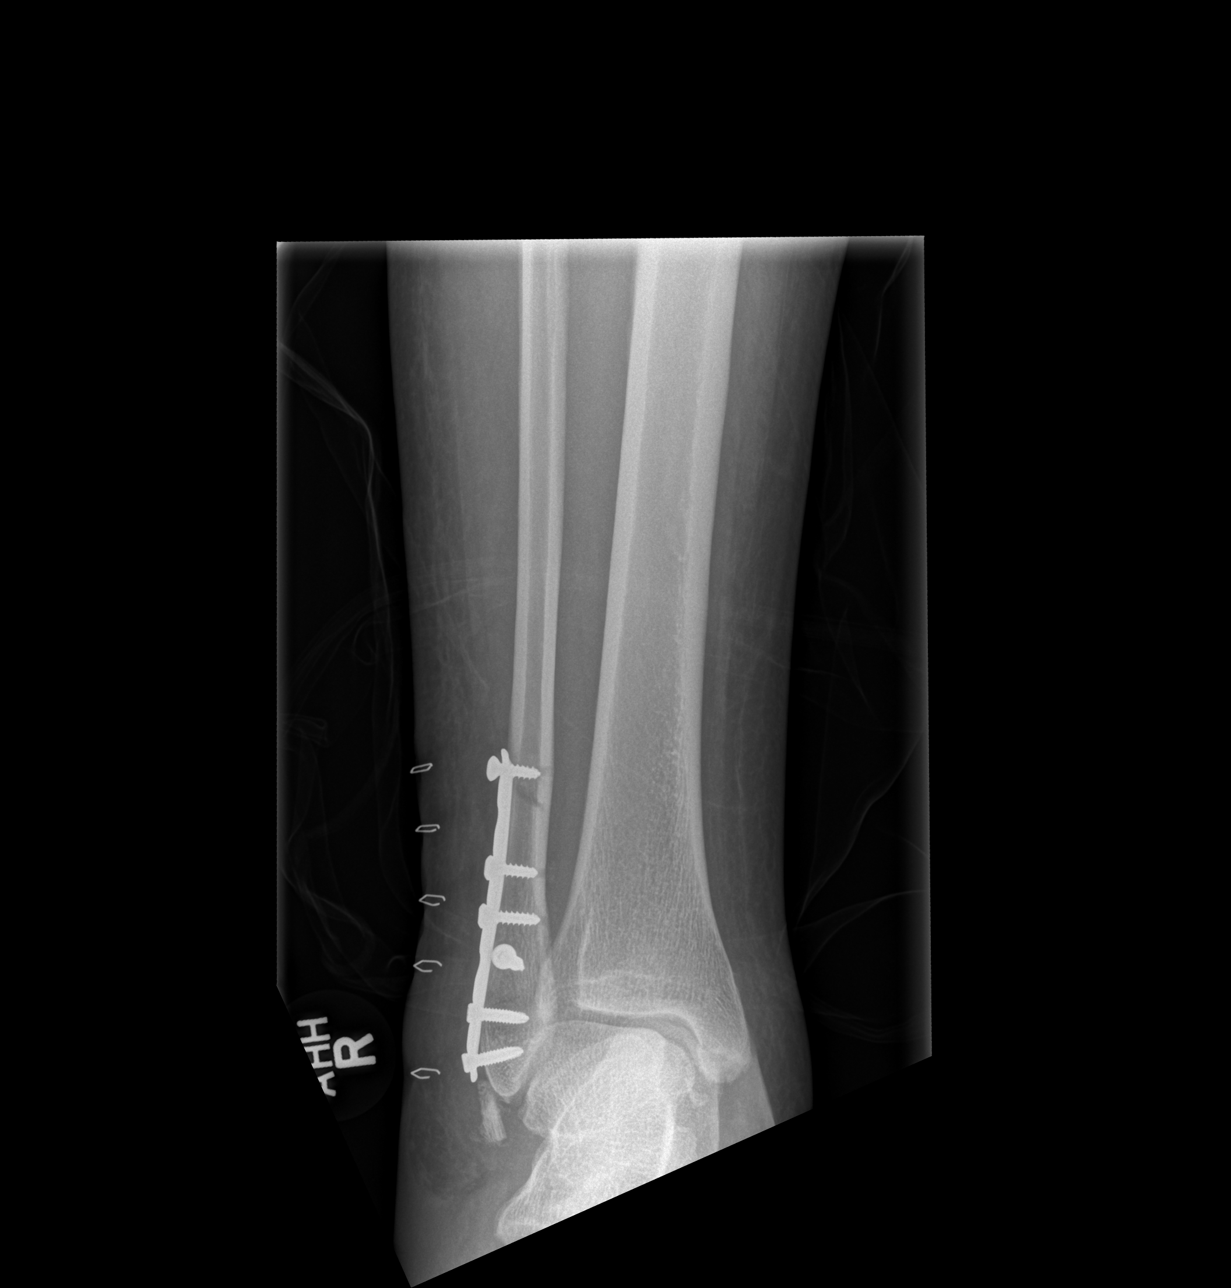

[x ankle obl right]
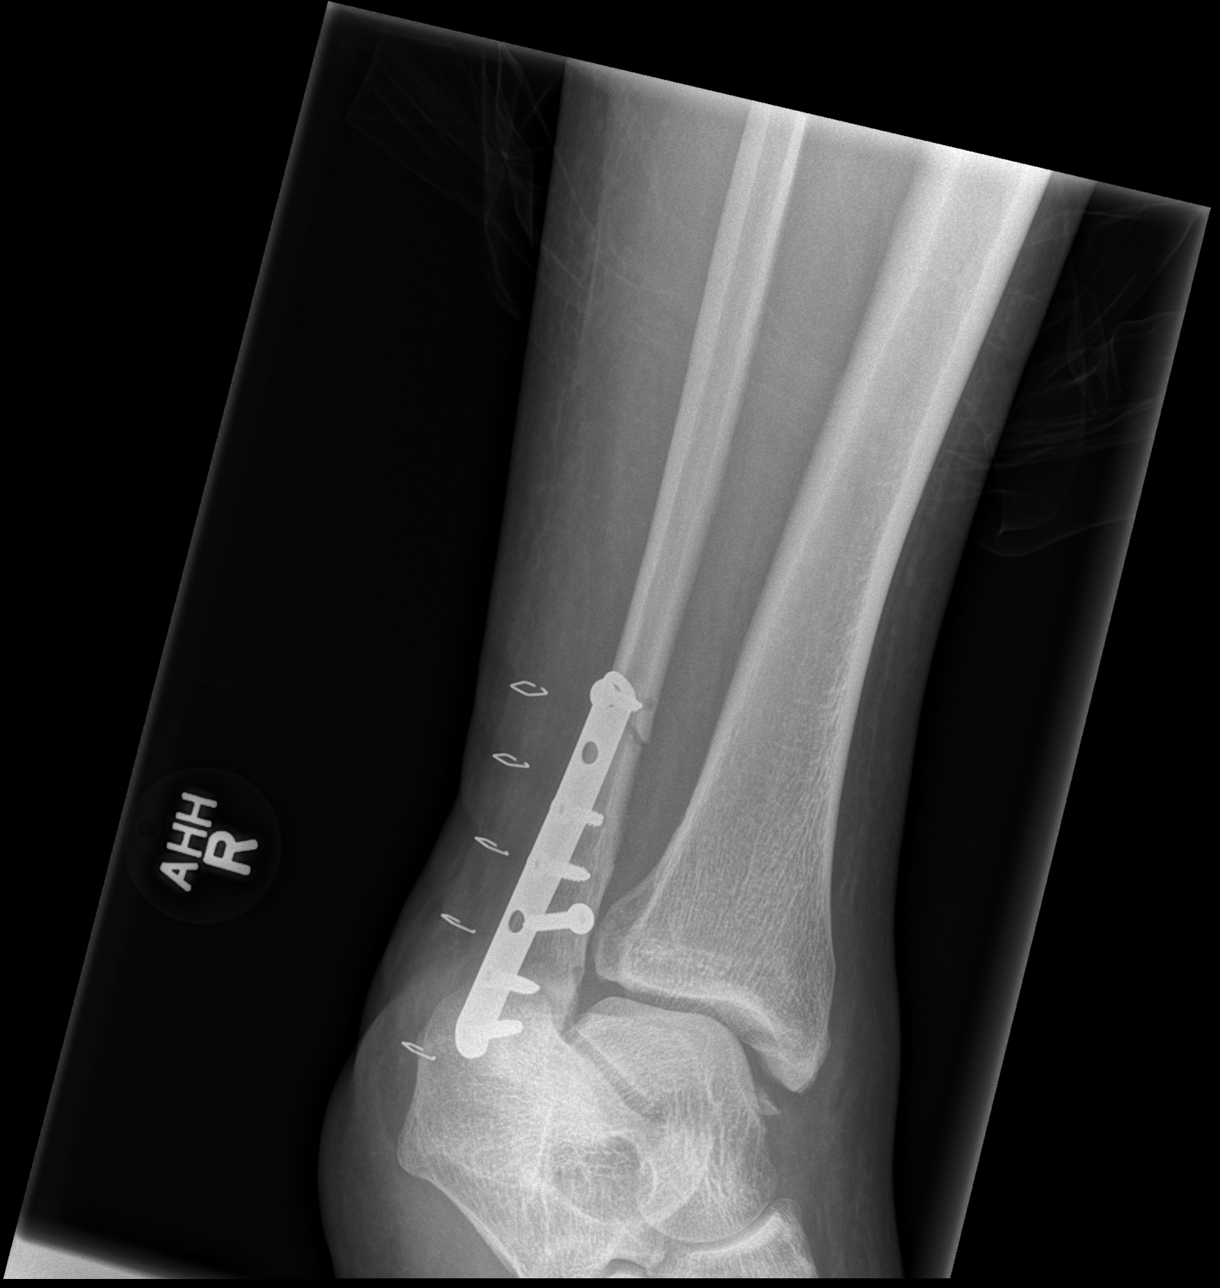

[3 of 3 positions shown; findings below may reference images not displayed]

FINDINGS: Three views of the right ankle submitted. Again noted mild displaced
fracture avulsion fracture distal tibia medial malleolus. Small
avulsion fracture posterior tibia is stable. There is a metallic
fixation plate in distal fibula and metallic fixation screws. There
is new mild displaced fracture in distal shaft of the right fibula
just below to superior screw. There is oblique extension of the
fracture line above the metallic fixation plate in distal fibula
seen on lateral view. There is plantar and posterior spurring of
calcaneus. Minimal widening of medial tibiotalar joint space noted
on oblique view.
IMPRESSION: Again noted mild displaced fracture avulsion fracture distal tibia
medial malleolus. Small avulsion fracture posterior tibia is stable.
There is a metallic fixation plate in distal fibula and metallic
fixation screws. There is new mild displaced fracture in distal
shaft of the right fibula just below to superior screw. There is
oblique extension of the fracture line above the metallic fixation
plate in distal fibula seen on lateral view. There is plantar and
posterior spurring of calcaneus. Minimal widening of medial
tibiotalar joint space noted on oblique view.

## 2016-02-01 IMAGING — CR DG TIBIA/FIBULA 2V*R*
3 series · 3 of 3 positions shown · non-contrast
Comparison: 09/28/2014 right ankle

CLINICAL DATA: Repeat fall, ankle fracture with surgery [DATE],
right ankle pain

EXAM:
RIGHT TIBIA AND FIBULA - 2 VIEW

[x tib-fib lat right]
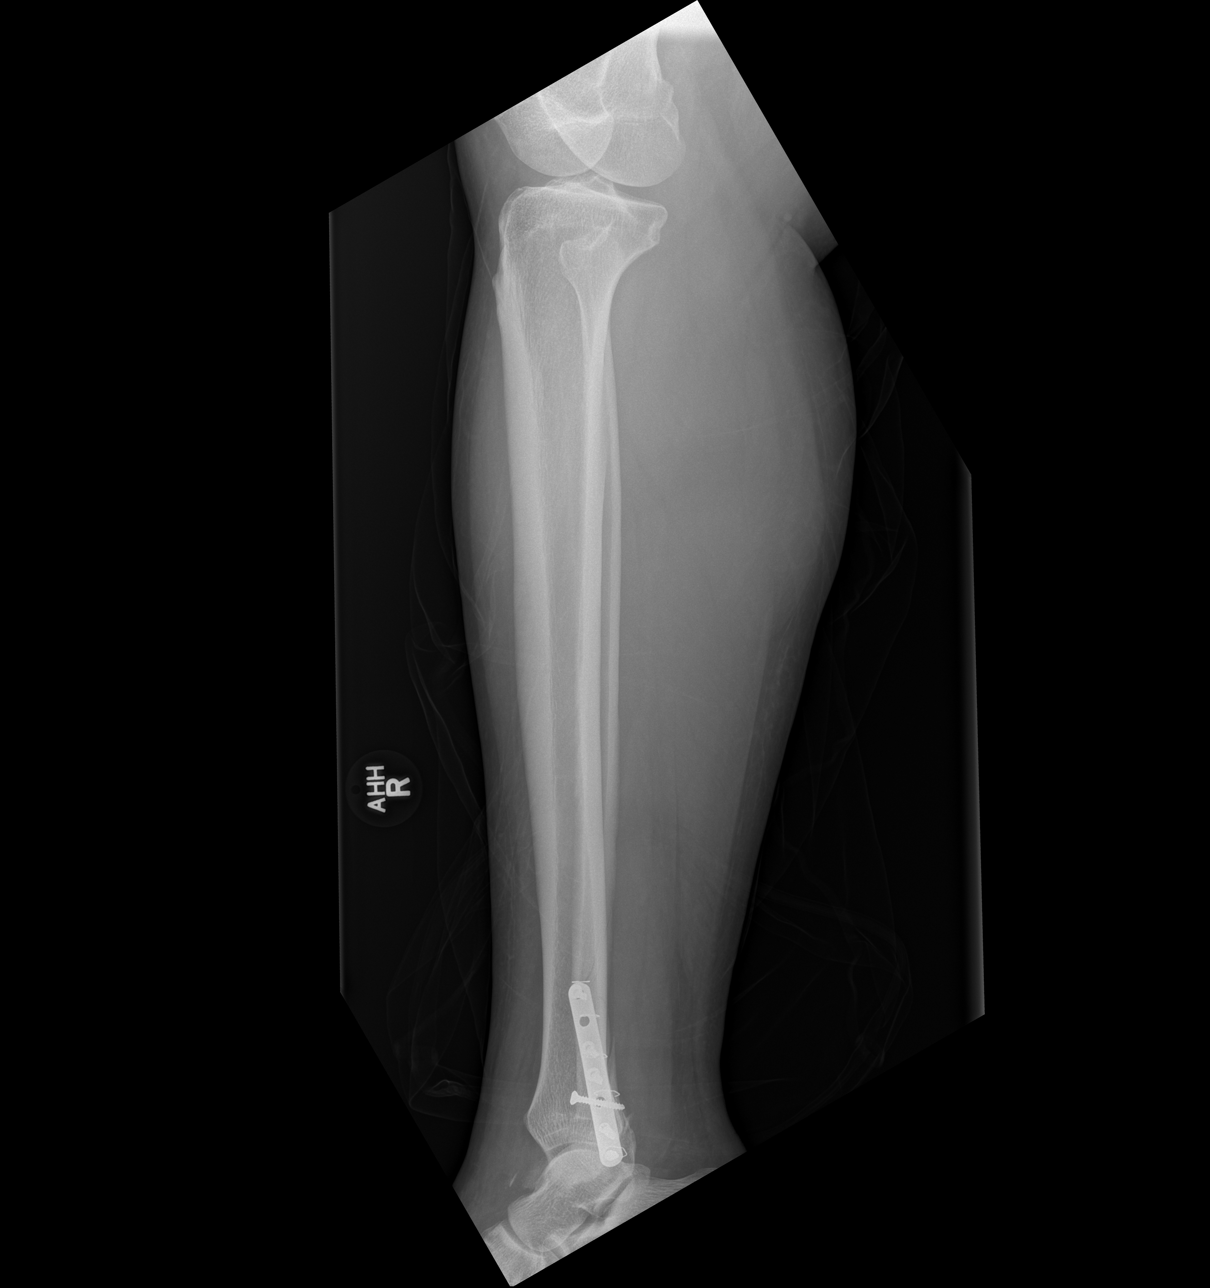

[x tib-fib ap right]
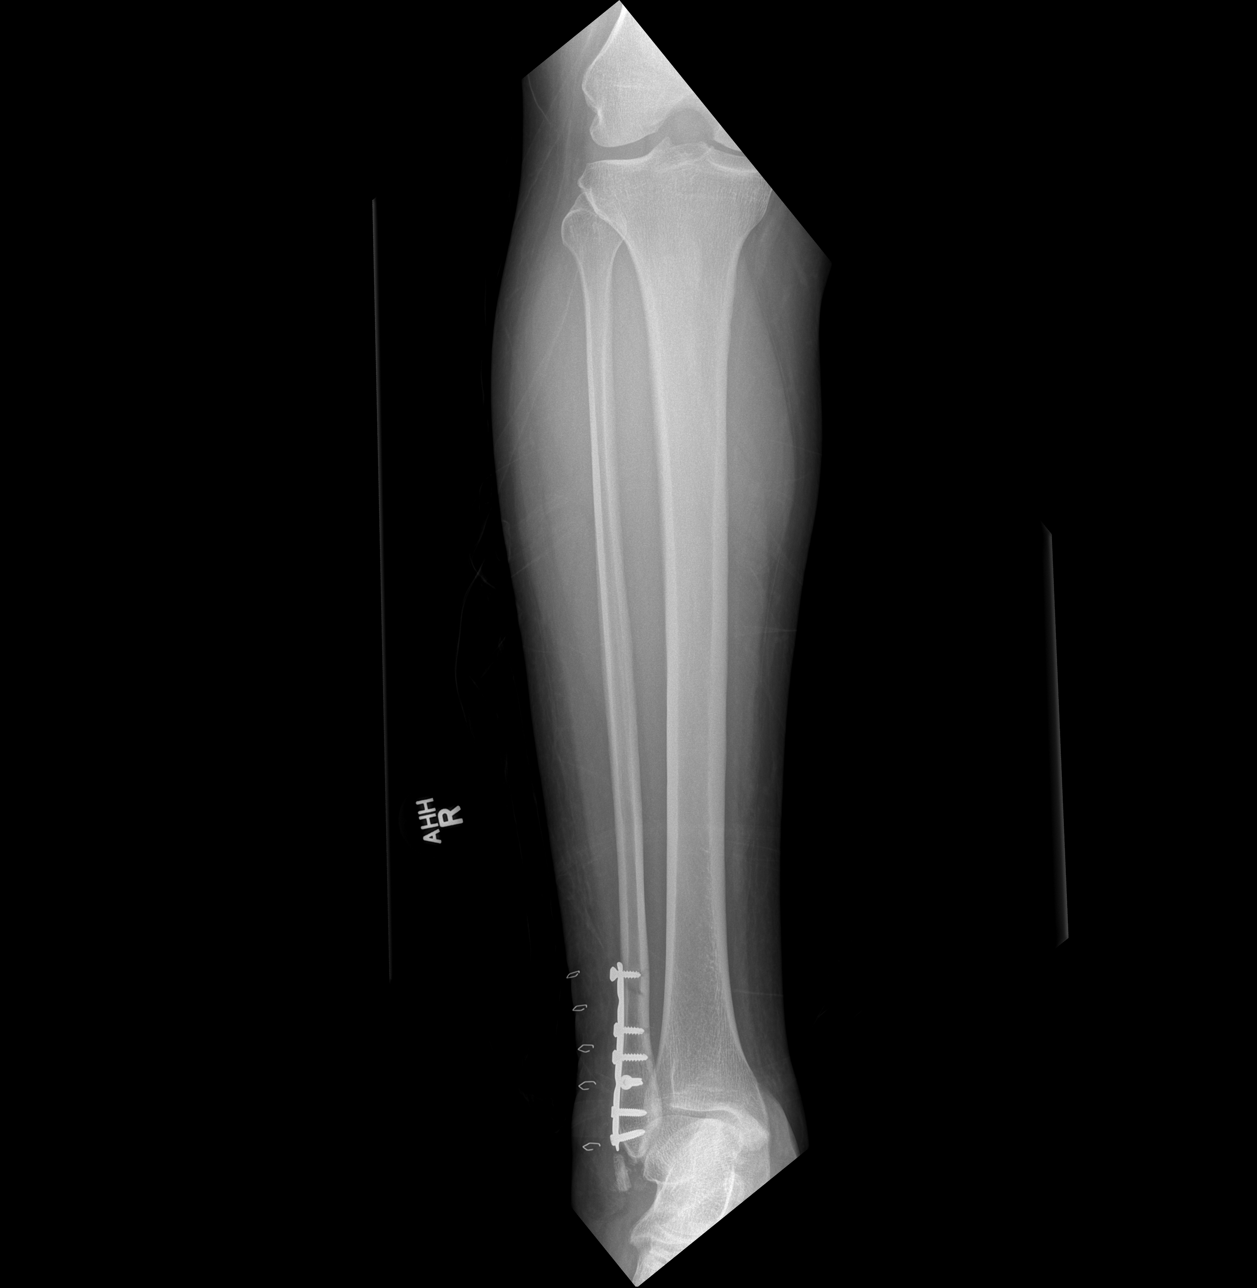

[x tib-fib right 0-3yrs]
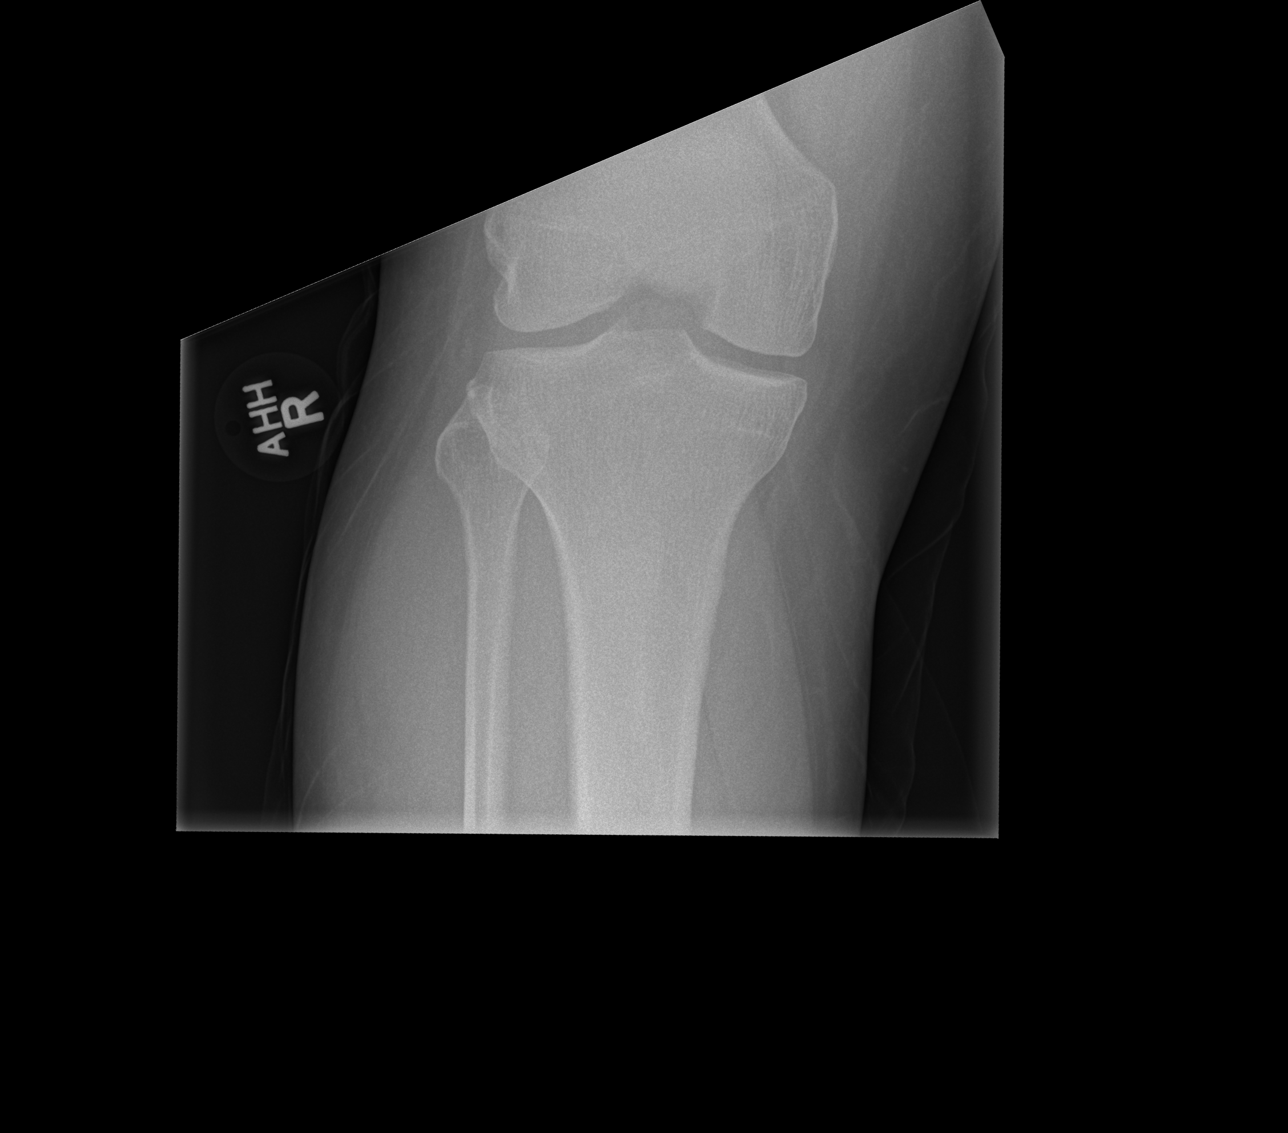

[3 of 3 positions shown; findings below may reference images not displayed]

FINDINGS: Three views of the right tibia-fibula submitted. Stable avulsion
fracture in distal tibia medial and posterior malleolus. There is a
metallic fixation plate noted in distal fibula. There is new minimal
displaced fracture in distal shaft of right fibula at the level of
last superior screw and above the screw. Again visible prior distal
fibular fracture. The upper tibia and fibula are intact.
IMPRESSION: Stable avulsion fracture in distal tibia medial and posterior
malleolus. There is a metallic fixation plate noted in distal
fibula. There is new minimal displaced fracture in distal shaft of
right fibula at the level of last superior screw and above the
screw. Again visible prior distal fibular fracture.

## 2022-11-25 ENCOUNTER — Ambulatory Visit: Payer: No Typology Code available for payment source | Admitting: Psychiatry
